# Patient Record
Sex: Male | Born: 1975 | Race: White | Hispanic: No | Marital: Single | Smoking: Never smoker
Health system: Southern US, Community
[De-identification: ages and names within clinical notes are randomized; demographics above are authoritative.]

## PROBLEM LIST (undated history)

## (undated) DIAGNOSIS — J189 Pneumonia, unspecified organism: Secondary | ICD-10-CM

## (undated) HISTORY — PX: COLONOSCOPY: SHX174

## (undated) HISTORY — PX: VASECTOMY: SHX75

---

## 2004-03-28 HISTORY — PX: WISDOM TOOTH EXTRACTION: SHX21

## 2014-04-18 ENCOUNTER — Ambulatory Visit: Payer: Self-pay | Admitting: Family

## 2014-04-22 ENCOUNTER — Ambulatory Visit (INDEPENDENT_AMBULATORY_CARE_PROVIDER_SITE_OTHER): Payer: Managed Care, Other (non HMO) | Admitting: Family

## 2014-04-22 ENCOUNTER — Other Ambulatory Visit (INDEPENDENT_AMBULATORY_CARE_PROVIDER_SITE_OTHER): Payer: Managed Care, Other (non HMO)

## 2014-04-22 ENCOUNTER — Encounter: Payer: Self-pay | Admitting: Family

## 2014-04-22 VITALS — BP 130/88 | HR 71 | Temp 98.2°F | Resp 18 | Ht 72.0 in | Wt 187.1 lb

## 2014-04-22 DIAGNOSIS — Z23 Encounter for immunization: Secondary | ICD-10-CM

## 2014-04-22 DIAGNOSIS — Z0000 Encounter for general adult medical examination without abnormal findings: Secondary | ICD-10-CM | POA: Insufficient documentation

## 2014-04-22 LAB — HEMOGLOBIN A1C: HEMOGLOBIN A1C: 5.1 % (ref 4.6–6.5)

## 2014-04-22 LAB — BASIC METABOLIC PANEL
BUN: 15 mg/dL (ref 6–23)
CO2: 26 meq/L (ref 19–32)
Calcium: 9.6 mg/dL (ref 8.4–10.5)
Chloride: 107 mEq/L (ref 96–112)
Creatinine, Ser: 0.95 mg/dL (ref 0.40–1.50)
GFR: 94.94 mL/min (ref >60.00)
Glucose, Bld: 95 mg/dL (ref 70–99)
Potassium: 4.7 mEq/L (ref 3.5–5.1)
SODIUM: 142 meq/L (ref 135–145)

## 2014-04-22 LAB — LIPID PANEL
Cholesterol: 163 mg/dL (ref 0–200)
HDL: 41.7 mg/dL (ref >39.00)
LDL Cholesterol: 93 mg/dL (ref 0–99)
NONHDL: 121.3
Total CHOL/HDL Ratio: 4
Triglycerides: 142 mg/dL (ref 0.0–149.0)
VLDL: 28.4 mg/dL (ref 0.0–40.0)

## 2014-04-22 LAB — CBC
HCT: 45.3 % (ref 39.0–52.0)
HEMOGLOBIN: 15.7 g/dL (ref 13.0–17.0)
MCHC: 34.6 g/dL (ref 30.0–36.0)
MCV: 88.5 fl (ref 78.0–100.0)
PLATELETS: 245 10*3/uL (ref 150.0–400.0)
RBC: 5.13 Mil/uL (ref 4.22–5.81)
RDW: 12.3 % (ref 11.5–15.5)
WBC: 5.3 10*3/uL (ref 4.0–10.5)

## 2014-04-22 LAB — TSH: TSH: 1.23 u[IU]/mL (ref 0.35–4.50)

## 2014-04-22 NOTE — Patient Instructions (Signed)
Thank you for choosing [facility].  Summary/Instructions:  Your prescription(s) have been submitted to your pharmacy or been printed and provided for you. Please take as directed and contact our office if you believe you are having problem(s) with the medication(s) or have any questions.  Please stop by the lab on the basement level of the building for your blood work. Your results will be released to MyChart (or called to you) after review, usually within 72 hours after test completion. If any changes need to be made, you will be notified at that same time.  Please stop by radiology on the basement level of the building for your x-rays. Your results will be released to MyChart (or called to you) after review, usually within 72 hours after test completion. If any treatments or changes are necessary, you will be notified at that same time.  Referrals have been made during this visit. You should expect to hear back from our schedulers in about 7-10 days in regards to establishing an appointment with the specialists we discussed.   Health Maintenance A healthy lifestyle and preventative care can promote health and wellness.  Maintain regular health, dental, and eye exams.  Eat a healthy diet. Foods like vegetables, fruits, whole grains, low-fat dairy products, and lean protein foods contain the nutrients you need and are low in calories. Decrease your intake of foods high in solid fats, added sugars, and salt. Get information about a proper diet from your health care provider, if necessary.  Regular physical exercise is one of the most important things you can do for your health. Most adults should get at least 150 minutes of moderate-intensity exercise (any activity that increases your heart rate and causes you to sweat) each week. In addition, most adults need muscle-strengthening exercises on 2 or more days a week.   Maintain a healthy weight. The body mass index (BMI) is a screening  tool to identify possible weight problems. It provides an estimate of body fat based on height and weight. Your health care provider can find your BMI and can help you achieve or maintain a healthy weight. For males 20 years and older:  A BMI below 18.5 is considered underweight.  A BMI of 18.5 to 24.9 is normal.  A BMI of 25 to 29.9 is considered overweight.  A BMI of 30 and above is considered obese.  Maintain normal blood lipids and cholesterol by exercising and minimizing your intake of saturated fat. Eat a balanced diet with plenty of fruits and vegetables. Blood tests for lipids and cholesterol should begin at age 47 and be repeated every 5 years. If your lipid or cholesterol levels are high, you are over age 75, or you are at high risk for heart disease, you may need your cholesterol levels checked more frequently.Ongoing high lipid and cholesterol levels should be treated with medicines if diet and exercise are not working.  If you smoke, find out from your health care provider how to quit. If you do not use tobacco, do not start.  Lung cancer screening is recommended for adults aged 55-80 years who are at high risk for developing lung cancer because of a history of smoking. A yearly low-dose CT scan of the lungs is recommended for people who have at least a 30-pack-year history of smoking and are current smokers or have quit within the past 15 years. A pack year of smoking is smoking an average of 1 pack of cigarettes a day for 1 year (for  example, a 30-pack-year history of smoking could mean smoking 1 pack a day for 30 years or 2 packs a day for 15 years). Yearly screening should continue until the smoker has stopped smoking for at least 15 years. Yearly screening should be stopped for people who develop a health problem that would prevent them from having lung cancer treatment.  If you choose to drink alcohol, do not have more than 2 drinks per day. One drink is considered to be 12 oz  (360 mL) of beer, 5 oz (150 mL) of wine, or 1.5 oz (45 mL) of liquor.  Avoid the use of street drugs. Do not share needles with anyone. Ask for help if you need support or instructions about stopping the use of drugs.  High blood pressure causes heart disease and increases the risk of stroke. Blood pressure should be checked at least every 1-2 years. Ongoing high blood pressure should be treated with medicines if weight loss and exercise are not effective.  If you are 2645-37 years old, ask your health care provider if you should take aspirin to prevent heart disease.  Diabetes screening involves taking a blood sample to check your fasting blood sugar level. This should be done once every 3 years after age 37 if you are at a normal weight and without risk factors for diabetes. Testing should be considered at a younger age or be carried out more frequently if you are overweight and have at least 1 risk factor for diabetes.  Colorectal cancer can be detected and often prevented. Most routine colorectal cancer screening begins at the age of 37 and continues through age 37. However, your health care provider may recommend screening at an earlier age if you have risk factors for colon cancer. On a yearly basis, your health care provider may provide home test kits to check for hidden blood in the stool. A small camera at the end of a tube may be used to directly examine the colon (sigmoidoscopy or colonoscopy) to detect the earliest forms of colorectal cancer. Talk to your health care provider about this at age 37 when routine screening begins. A direct exam of the colon should be repeated every 5-10 years through age 37, unless early forms of precancerous polyps or small growths are found.  People who are at an increased risk for hepatitis B should be screened for this virus. You are considered at high risk for hepatitis B if:  You were born in a country where hepatitis B occurs often. Talk with your health  care provider about which countries are considered high risk.  Your parents were born in a high-risk country and you have not received a shot to protect against hepatitis B (hepatitis B vaccine).  You have HIV or AIDS.  You use needles to inject street drugs.  You live with, or have sex with, someone who has hepatitis B.  You are a man who has sex with other men (MSM).  You get hemodialysis treatment.  You take certain medicines for conditions like cancer, organ transplantation, and autoimmune conditions.  Hepatitis C blood testing is recommended for all people born from [id number] through 1965 and any individual with known risk factors for hepatitis C.  Healthy men should no longer receive prostate-specific antigen (PSA) blood tests as part of routine cancer screening. Talk to your health care provider about prostate cancer screening.  Testicular cancer screening is not recommended for adolescents or adult males who have no symptoms. Screening includes self-exam, a  health care provider exam, and other screening tests. Consult with your health care provider about any symptoms you have or any concerns you have about testicular cancer.  Practice safe sex. Use condoms and avoid high-risk sexual practices to reduce the spread of sexually transmitted infections (STIs).  You should be screened for STIs, including gonorrhea and chlamydia if:  You are sexually active and are younger than 24 years.  You are older than 24 years, and your health care provider tells you that you are at risk for this type of infection.  Your sexual activity has changed since you were last screened, and you are at an increased risk for chlamydia or gonorrhea. Ask your health care provider if you are at risk.  If you are at risk of being infected with HIV, it is recommended that you take a prescription medicine daily to prevent HIV infection. This is called pre-exposure prophylaxis (PrEP). You are considered at risk  if:  You are a man who has sex with other men (MSM).  You are a heterosexual man who is sexually active with multiple partners.  You take drugs by injection.  You are sexually active with a partner who has HIV.  Talk with your health care provider about whether you are at high risk of being infected with HIV. If you choose to begin PrEP, you should first be tested for HIV. You should then be tested every 3 months for as long as you are taking PrEP.  Use sunscreen. Apply sunscreen liberally and repeatedly throughout the day. You should seek shade when your shadow is shorter than you. Protect yourself by wearing long sleeves, pants, a wide-brimmed hat, and sunglasses year round whenever you are outdoors.  Tell your health care provider of new moles or changes in moles, especially if there is a change in shape or color. Also, tell your health care provider if a mole is larger than the size of a pencil eraser.  A one-time screening for abdominal aortic aneurysm (AAA) and surgical repair of large AAAs by ultrasound is recommended for men aged 65-75 years who are current or former smokers.  Stay current with your vaccines (immunizations). Document Released: 09/10/2007 Document Revised: 03/19/2013 Document Reviewed: 08/09/2010 [facility] Patient Information 2015 [place], [place]. This information is not intended to replace advice given to you by your health care provider. Make sure you discuss any questions you have with your health care provider.

## 2014-04-22 NOTE — Progress Notes (Signed)
Subjective:    Patient ID: Greg Cooper, male    DOB: February 08, 1976, 39 y.o.   MRN: [id number]  Chief Complaint  Patient presents with  . Establish Care    Fasting would like a cpe    HPI:  Greg SkillBrian Heuberger is a 39 y.o. male who presents today for an annual wellness visit.   1) Health Maintenance - States his health is pretty good  Diet - eats 3 meals per day; vegetables, greens, protein; occasional fried and processed foods.   [facility] averaging 2-3 times per week.    2) Preventative Exams / Immunizations:  Dental -- Up to date  Vision -- Up to date    Health Maintenance  Topic Date Due  . TETANUS/TDAP  07/16/1996  . INFLUENZA VACCINE  10/26/2013  Tetanus shot;    There is no immunization history on file for this patient.  No Known Allergies   No current outpatient prescriptions on file prior to visit.   No current facility-administered medications on file prior to visit.    History reviewed. No pertinent past medical history.  History reviewed. No pertinent past surgical history.  Family History  Problem Relation Age of Onset  . Leukemia Father     History   Social History  . Marital Status: Married    Spouse Name: N/A    Number of Children: 2  . Years of Education: 18   Occupational History  . [employer]    Social History Main Topics  . Smoking status: Never Smoker   . Smokeless tobacco: Never Used  . Alcohol Use: 2.4 oz/week    0 Not specified, 4 Cans of beer per week  . Drug Use: No  . Sexual Activity: Not on file   Other Topics Concern  . Not on file   Social History Narrative   [place] Currently resides in a house with his wife and 2 children. 2 dogs. Fun: Softball, gym   Denies religious beliefs that would effect health care.     Review of Systems  Constitutional: Denies fever, chills, fatigue, or significant weight gain/loss. HENT: Head: Denies  headache or neck pain Ears: Denies changes in hearing, ringing in ears, earache, drainage Nose: Denies discharge, stuffiness, itching, nosebleed, sinus pain Throat: Denies sore throat, hoarseness, dry mouth, sores, thrush Eyes: Denies loss/changes in vision, pain, redness, blurry/double vision, flashing lights Cardiovascular: Denies chest pain/discomfort, tightness, palpitations, shortness of breath with activity, difficulty lying down, swelling, sudden awakening with shortness of breath Respiratory: Denies shortness of breath, cough, sputum production, wheezing Gastrointestinal: Denies dysphasia, heartburn, change in appetite, nausea, change in bowel habits, rectal bleeding, constipation, diarrhea, yellow skin or eyes Genitourinary: Denies frequency, urgency, burning/pain, blood in urine, incontinence, change in urinary strength. Musculoskeletal: Denies muscle/joint pain, stiffness, back pain, redness or swelling of joints, trauma Skin: Denies rashes, lumps, itching, dryness, color changes, or hair/nail changes Neurological: Denies dizziness, fainting, seizures, weakness, numbness, tingling, tremor Psychiatric - Denies nervousness, stress, depression or memory loss Endocrine: Denies heat or cold intolerance, sweating, frequent urination, excessive thirst, changes in appetite Hematologic: Denies ease of bruising or bleeding     Objective:    BP 130/88 mmHg  Pulse 71  Temp(Src) 98.2 F (36.8 C) (Oral)  Resp 18  Ht 6' (1.829 m)  Wt 187 lb 1.9 oz (84.877 kg)  BMI 25.37 kg/m2  SpO2 97% Nursing note and vital signs reviewed.  Physical Exam  Constitutional: He is oriented to person, place, and time. He appears well-developed and well-nourished.  HENT:  Head: Normocephalic.  Right Ear: Hearing, tympanic membrane, external ear and ear canal normal.  Left Ear: Hearing, tympanic membrane, external ear and ear canal normal.  Nose: Nose normal.  Mouth/Throat: Uvula is midline, oropharynx is  clear and moist and mucous membranes are normal.  Eyes: Conjunctivae and EOM are normal. Pupils are equal, round, and reactive to light.  Neck: Neck supple. No JVD present. No tracheal deviation present. No thyromegaly present.  Cardiovascular: Normal rate, regular rhythm, normal heart sounds and intact distal pulses.   Pulmonary/Chest: Effort normal and breath sounds normal.  Abdominal: Soft. Bowel sounds are normal. He exhibits no distension and no mass. There is no tenderness. There is no rebound and no guarding.  Musculoskeletal: Normal range of motion. He exhibits no edema or tenderness.  Lymphadenopathy:    He has no cervical adenopathy.  Neurological: He is alert and oriented to person, place, and time. He has normal reflexes. No cranial nerve deficit. He exhibits normal muscle tone. Coordination normal.  Skin: Skin is warm and dry.  Psychiatric: He has a normal mood and affect. His behavior is normal. Judgment and thought content normal.       Assessment & Plan:

## 2014-04-22 NOTE — Assessment & Plan Note (Signed)
1) Anticipatory Guidance: Discussed importance of wearing a seatbelt while driving and not texting while driving; changing batteries in smoke detector at least once annually; wearing suntan lotion when outside; eating a balanced and moderate diet; getting physical activity at least 30 minutes per day.  2) Immunizations / Screenings / Labs:  Tetanus shot given in office today. All other immunizations are up to date per recommendations. All screenings are up to date per recommendations. Obtain CBC, BMET, Lipid profile and TSH.    Overall well exam. Minimal risk factors noted at this time. Continue current healthy lifestyle choices. Follow up prevention exam in 1 year.

## 2014-04-22 NOTE — Addendum Note (Signed)
Addended by: Mercer PodWRENN, Itsel Opfer E on: 04/22/2014 09:07 AM   Modules accepted: Orders

## 2014-04-22 NOTE — Progress Notes (Signed)
Pre visit review using our clinic review tool, if applicable. No additional management support is needed unless otherwise documented below in the visit note. 

## 2014-04-23 ENCOUNTER — Telehealth: Payer: Self-pay | Admitting: Family

## 2014-04-23 NOTE — Telephone Encounter (Signed)
Please inform the patient that his lab work is all within the normal expected ranges. Therefore follow up or 1 year or sooner if needed.

## 2014-04-23 NOTE — Telephone Encounter (Signed)
Called pt. Left message for him to call back.

## 2014-04-24 NOTE — Telephone Encounter (Signed)
Pt aware of results 

## 2015-01-14 ENCOUNTER — Ambulatory Visit (INDEPENDENT_AMBULATORY_CARE_PROVIDER_SITE_OTHER): Payer: Managed Care, Other (non HMO) | Admitting: Family

## 2015-01-14 ENCOUNTER — Encounter: Payer: Self-pay | Admitting: Family

## 2015-01-14 VITALS — BP 110/70 | HR 72 | Temp 97.9°F | Resp 18 | Ht 72.0 in | Wt 192.0 lb

## 2015-01-14 DIAGNOSIS — S39012A Strain of muscle, fascia and tendon of lower back, initial encounter: Secondary | ICD-10-CM | POA: Diagnosis not present

## 2015-01-14 NOTE — Assessment & Plan Note (Signed)
Symptoms and exam consistent with low back strain. Treat conservatively with heat, stretching and home exercise therapy, and over-the-counter anti-inflammatories as needed for discomfort. Follow up if symptoms worsen or fail to improve with conservative therapy.

## 2015-01-14 NOTE — Progress Notes (Signed)
   Subjective:    Patient ID: Greg Cooper, male    DOB: October 06, 1975, 39 y.o.   MRN: [id number]  Chief Complaint  Patient presents with  . Back Pain    Woke up a week or so ago with a pain in the lower part of his back, feels it most when sitting down for a long period and then standing up    HPI:  Greg SkillBrian Reveles is a 39 y.o. male who  has no past medical history on file. and presents today for an an acute office visit.   This is a new problem. Associated symptom of pain located in his lower back has been going on for about a week and described as dull and persistent and a severity of about 3/10. Denies radiculopathy or saddle anesthesia. Modifying factors include a heating pad and some OTC antiinflammatories which did not help very much.  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 Denies trauma or any sounds/sesations heard or felt. Did some resistance training a few days prior to onset.   No Known Allergies   No current outpatient prescriptions on file prior to visit.   No current facility-administered medications on file prior to visit.    Review of Systems  Constitutional: Negative for fever and chills.  Genitourinary: Negative for flank pain.  Musculoskeletal: Positive for back pain.  Neurological: Negative for weakness and numbness.      Objective:    BP 110/70 mmHg  Pulse 72  Temp(Src) 97.9 F (36.6 C) (Oral)  Resp 18  Ht 6' (1.829 m)  Wt 192 lb (87.091 kg)  BMI 26.03 kg/m2  SpO2 97% Nursing note and vital signs reviewed.  Physical Exam  Constitutional: He is oriented to person, place, and time. He appears well-developed and well-nourished. No distress.  Cardiovascular: Normal rate, regular rhythm, normal heart sounds and intact distal pulses.   Pulmonary/Chest: Effort normal and breath sounds normal.  Musculoskeletal:  Low back - no obvious deformity, discoloration, or edema noted. Tenderness elicited over lumbar paraspinal  musculature on the right side. Range of motion is limited secondary to muscular tightness. There is tightness noted in his hamstrings and hip flexors with a positive Thomas test. Straight leg raises positive. Distal pulses, sensation, and reflexes are intact and appropriate.  Neurological: He is alert and oriented to person, place, and time.  Skin: Skin is warm and dry.  Psychiatric: He has a normal mood and affect. His behavior is normal. Judgment and thought content normal.       Assessment & Plan:   Problem List Items Addressed This Visit      Musculoskeletal and Integument   Low back strain - Primary    Symptoms and exam consistent with low back strain. Treat conservatively with heat, stretching and home exercise therapy, and over-the-counter anti-inflammatories as needed for discomfort. Follow up if symptoms worsen or fail to improve with conservative therapy.

## 2015-01-14 NOTE — Patient Instructions (Signed)
Thank you for choosing [facility].  Summary/Instructions:  Ibuprofen or Aleve as needed.  Heat 2-3x per day - moist heat preferable or Thermacare.  If your symptoms worsen or fail to improve, please contact our office for further instruction, or in case of emergency go directly to the emergency room at the closest medical facility.   Low Back Strain With Rehab A strain is an injury in which a tendon or muscle is torn. The muscles and tendons of the lower back are vulnerable to strains. However, these muscles and tendons are very strong and require a great force to be injured. Strains are classified into three categories. Grade 1 strains cause pain, but the tendon is not lengthened. Grade 2 strains include a lengthened ligament, due to the ligament being stretched or partially ruptured. With grade 2 strains there is still function, although the function may be decreased. Grade 3 strains involve a complete tear of the tendon or muscle, and function is usually impaired. SYMPTOMS   Pain in the lower back.  Pain that affects one side more than the other.  Pain that gets worse with movement and may be felt in the hip, buttocks, or back of the thigh.  Muscle spasms of the muscles in the back.  Swelling along the muscles of the back.  Loss of strength of the back muscles.  Crackling sound (crepitation) when the muscles are touched. CAUSES  Lower back strains occur when a force is placed on the muscles or tendons that is greater than they can handle. Common causes of injury include:  Prolonged overuse of the muscle-tendon units in the lower back, usually from incorrect posture.  A single violent injury or force applied to the back. RISK INCREASES WITH:  Sports that involve twisting forces on the spine or a lot of bending at the waist (football, rugby, weightlifting, bowling, golf, tennis, speed skating, racquetball, swimming, running, gymnastics, diving).  Poor strength and  flexibility.  Failure to warm up properly before activity.  Family history of lower back pain or disk disorders.  Previous back injury or surgery (especially fusion).  Poor posture with lifting, especially heavy objects.  Prolonged sitting, especially with poor posture. PREVENTION   Learn and use proper posture when sitting or lifting (maintain proper posture when sitting, lift using the knees and legs, not at the waist).  Warm up and stretch properly before activity.  Allow for adequate recovery between workouts.  Maintain physical fitness:  Strength, flexibility, and endurance.  Cardiovascular fitness. PROGNOSIS  If treated properly, lower back strains usually heal within 6 weeks. RELATED COMPLICATIONS   Recurring symptoms, resulting in a chronic problem.  Chronic inflammation, scarring, and partial muscle-tendon tear.  Delayed healing or resolution of symptoms.  Prolonged disability. TREATMENT  Treatment first involves the use of ice and medicine, to reduce pain and inflammation. The use of strengthening and stretching exercises may help reduce pain with activity. These exercises may be performed at home or with a therapist. Severe injuries may require referral to a therapist for further evaluation and treatment, such as ultrasound. Your caregiver may advise that you wear a back brace or corset, to help reduce pain and discomfort. Often, prolonged bed rest results in greater harm then benefit. Corticosteroid injections may be recommended. However, these should be reserved for the most serious cases. It is important to avoid using your back when lifting objects. At night, sleep on your back on a firm mattress with a pillow placed under your knees. If non-surgical  treatment is unsuccessful, surgery may be needed.  MEDICATION   If pain medicine is needed, nonsteroidal anti-inflammatory medicines (aspirin and ibuprofen), or other minor pain relievers (acetaminophen), are often  advised.  Do not take pain medicine for 7 days before surgery.  Prescription pain relievers may be given, if your caregiver thinks they are needed. Use only as directed and only as much as you need.  Ointments applied to the skin may be helpful.  Corticosteroid injections may be given by your caregiver. These injections should be reserved for the most serious cases, because they may only be given a certain number of times. HEAT AND COLD  Cold treatment (icing) should be applied for 10 to 15 minutes every 2 to 3 hours for inflammation and pain, and immediately after activity that aggravates your symptoms. Use ice packs or an ice massage.  Heat treatment may be used before performing stretching and strengthening activities prescribed by your caregiver, physical therapist, or athletic trainer. Use a heat pack or a warm water soak. SEEK MEDICAL CARE IF:   Symptoms get worse or do not improve in 2 to 4 weeks, despite treatment.  You develop numbness, weakness, or loss of bowel or bladder function.  New, unexplained symptoms develop. (Drugs used in treatment may produce side effects.) EXERCISES  RANGE OF MOTION (ROM) AND STRETCHING EXERCISES - Low Back Strain Most people with lower back pain will find that their symptoms get worse with excessive bending forward (flexion) or arching at the lower back (extension). The exercises which will help resolve your symptoms will focus on the opposite motion.  Your physician, physical therapist or athletic trainer will help you determine which exercises will be most helpful to resolve your lower back pain. Do not complete any exercises without first consulting with your caregiver. Discontinue any exercises which make your symptoms worse until you speak to your caregiver.  If you have pain, numbness or tingling which travels down into your buttocks, leg or foot, the goal of the therapy is for these symptoms to move closer to your back and eventually resolve.  Sometimes, these leg symptoms will get better, but your lower back pain may worsen. This is typically an indication of progress in your rehabilitation. Be very alert to any changes in your symptoms and the activities in which you participated in the 24 hours prior to the change. Sharing this information with your caregiver will allow him/her to most efficiently treat your condition.  These exercises may help you when beginning to rehabilitate your injury. Your symptoms may resolve with or without further involvement from your physician, physical therapist or athletic trainer. While completing these exercises, remember:  Restoring tissue flexibility helps normal motion to return to the joints. This allows healthier, less painful movement and activity.  An effective stretch should be held for at least 30 seconds.  A stretch should never be painful. You should only feel a gentle lengthening or release in the stretched tissue. FLEXION RANGE OF MOTION AND STRETCHING EXERCISES: STRETCH - Flexion, Single Knee to Chest   Lie on a firm bed or floor with both legs extended in front of you.  Keeping one leg in contact with the floor, bring your opposite knee to your chest. Hold your leg in place by either grabbing behind your thigh or at your knee.  Pull until you feel a gentle stretch in your lower back. Hold __________ seconds.  Slowly release your grasp and repeat the exercise with the opposite side. Repeat __________ times.  Complete this exercise __________ times per day.  STRETCH - Flexion, Double Knee to Chest   Lie on a firm bed or floor with both legs extended in front of you.  Keeping one leg in contact with the floor, bring your opposite knee to your chest.  Tense your stomach muscles to support your back and then lift your other knee to your chest. Hold your legs in place by either grabbing behind your thighs or at your knees.  Pull both knees toward your chest until you feel a gentle  stretch in your lower back. Hold __________ seconds.  Tense your stomach muscles and slowly return one leg at a time to the floor. Repeat __________ times. Complete this exercise __________ times per day.  STRETCH - Low Trunk Rotation  Lie on a firm bed or floor. Keeping your legs in front of you, bend your knees so they are both pointed toward the ceiling and your feet are flat on the floor.  Extend your arms out to the side. This will stabilize your upper body by keeping your shoulders in contact with the floor.  Gently and slowly drop both knees together to one side until you feel a gentle stretch in your lower back. Hold for __________ seconds.  Tense your stomach muscles to support your lower back as you bring your knees back to the starting position. Repeat the exercise to the other side. Repeat __________ times. Complete this exercise __________ times per day  EXTENSION RANGE OF MOTION AND FLEXIBILITY EXERCISES: STRETCH - Extension, Prone on Elbows   Lie on your stomach on the floor, a bed will be too soft. Place your palms about shoulder width apart and at the height of your head.  Place your elbows under your shoulders. If this is too painful, stack pillows under your chest.  Allow your body to relax so that your hips drop lower and make contact more completely with the floor.  Hold this position for __________ seconds.  Slowly return to lying flat on the floor. Repeat __________ times. Complete this exercise __________ times per day.  RANGE OF MOTION - Extension, Prone Press Ups  Lie on your stomach on the floor, a bed will be too soft. Place your palms about shoulder width apart and at the height of your head.  Keeping your back as relaxed as possible, slowly straighten your elbows while keeping your hips on the floor. You may adjust the placement of your hands to maximize your comfort. As you gain motion, your hands will come more underneath your shoulders.  Hold this  position __________ seconds.  Slowly return to lying flat on the floor. Repeat __________ times. Complete this exercise __________ times per day.  RANGE OF MOTION- Quadruped, Neutral Spine   Assume a hands and knees position on a firm surface. Keep your hands under your shoulders and your knees under your hips. You may place padding under your knees for comfort.  Drop your head and point your tail bone toward the ground below you. This will round out your lower back like an angry cat. Hold this position for __________ seconds.  Slowly lift your head and release your tail bone so that your back sags into a large arch, like an old horse.  Hold this position for __________ seconds.  Repeat this until you feel limber in your lower back.  Now, find your "sweet spot." This will be the most comfortable position somewhere between the two previous positions. This is your neutral  spine. Once you have found this position, tense your stomach muscles to support your lower back.  Hold this position for __________ seconds. Repeat __________ times. Complete this exercise __________ times per day.  STRENGTHENING EXERCISES - Low Back Strain These exercises may help you when beginning to rehabilitate your injury. These exercises should be done near your "sweet spot." This is the neutral, low-back arch, somewhere between fully rounded and fully arched, that is your least painful position. When performed in this safe range of motion, these exercises can be used for people who have either a flexion or extension based injury. These exercises may resolve your symptoms with or without further involvement from your physician, physical therapist or athletic trainer. While completing these exercises, remember:   Muscles can gain both the endurance and the strength needed for everyday activities through controlled exercises.  Complete these exercises as instructed by your physician, physical therapist or athletic  trainer. Increase the resistance and repetitions only as guided.  You may experience muscle soreness or fatigue, but the pain or discomfort you are trying to eliminate should never worsen during these exercises. If this pain does worsen, stop and make certain you are following the directions exactly. If the pain is still present after adjustments, discontinue the exercise until you can discuss the trouble with your caregiver. STRENGTHENING - Deep Abdominals, Pelvic Tilt  Lie on a firm bed or floor. Keeping your legs in front of you, bend your knees so they are both pointed toward the ceiling and your feet are flat on the floor.  Tense your lower abdominal muscles to press your lower back into the floor. This motion will rotate your pelvis so that your tail bone is scooping upwards rather than pointing at your feet or into the floor.  With a gentle tension and even breathing, hold this position for __________ seconds. Repeat __________ times. Complete this exercise __________ times per day.  STRENGTHENING - Abdominals, Crunches   Lie on a firm bed or floor. Keeping your legs in front of you, bend your knees so they are both pointed toward the ceiling and your feet are flat on the floor. Cross your arms over your chest.  Slightly tip your chin down without bending your neck.  Tense your abdominals and slowly lift your trunk high enough to just clear your shoulder blades. Lifting higher can put excessive stress on the lower back and does not further strengthen your abdominal muscles.  Control your return to the starting position. Repeat __________ times. Complete this exercise __________ times per day.  STRENGTHENING - Quadruped, Opposite UE/LE Lift   Assume a hands and knees position on a firm surface. Keep your hands under your shoulders and your knees under your hips. You may place padding under your knees for comfort.  Find your neutral spine and gently tense your abdominal muscles so that  you can maintain this position. Your shoulders and hips should form a rectangle that is parallel with the floor and is not twisted.  Keeping your trunk steady, lift your right hand no higher than your shoulder and then your left leg no higher than your hip. Make sure you are not holding your breath. Hold this position __________ seconds.  Continuing to keep your abdominal muscles tense and your back steady, slowly return to your starting position. Repeat with the opposite arm and leg. Repeat __________ times. Complete this exercise __________ times per day.  STRENGTHENING - Lower Abdominals, Double Knee Lift  Lie on a firm  bed or floor. Keeping your legs in front of you, bend your knees so they are both pointed toward the ceiling and your feet are flat on the floor.  Tense your abdominal muscles to brace your lower back and slowly lift both of your knees until they come over your hips. Be certain not to hold your breath.  Hold __________ seconds. Using your abdominal muscles, return to the starting position in a slow and controlled manner. Repeat __________ times. Complete this exercise __________ times per day.  POSTURE AND BODY MECHANICS CONSIDERATIONS - Low Back Strain Keeping correct posture when sitting, standing or completing your activities will reduce the stress put on different body tissues, allowing injured tissues a chance to heal and limiting painful experiences. The following are general guidelines for improved posture. Your physician or physical therapist will provide you with any instructions specific to your needs. While reading these guidelines, remember:  The exercises prescribed by your provider will help you have the flexibility and strength to maintain correct postures.  The correct posture provides the best environment for your joints to work. All of your joints have less wear and tear when properly supported by a spine with good posture. This means you will experience a  healthier, less painful body.  Correct posture must be practiced with all of your activities, especially prolonged sitting and standing. Correct posture is as important when doing repetitive low-stress activities (typing) as it is when doing a single heavy-load activity (lifting). RESTING POSITIONS Consider which positions are most painful for you when choosing a resting position. If you have pain with flexion-based activities (sitting, bending, stooping, squatting), choose a position that allows you to rest in a less flexed posture. You would want to avoid curling into a fetal position on your side. If your pain worsens with extension-based activities (prolonged standing, working overhead), avoid resting in an extended position such as sleeping on your stomach. Most people will find more comfort when they rest with their spine in a more neutral position, neither too rounded nor too arched. Lying on a non-sagging bed on your side with a pillow between your knees, or on your back with a pillow under your knees will often provide some relief. Keep in mind, being in any one position for a prolonged period of time, no matter how correct your posture, can still lead to stiffness. PROPER SITTING POSTURE In order to minimize stress and discomfort on your spine, you must sit with correct posture. Sitting with good posture should be effortless for a healthy body. Returning to good posture is a gradual process. Many people can work toward this most comfortably by using various supports until they have the flexibility and strength to maintain this posture on their own. When sitting with proper posture, your ears will fall over your shoulders and your shoulders will fall over your hips. You should use the back of the chair to support your upper back. Your lower back will be in a neutral position, just slightly arched. You may place a small pillow or folded towel at the base of your lower back for support.  When working  at a desk, create an environment that supports good, upright posture. Without extra support, muscles tire, which leads to excessive strain on joints and other tissues. Keep these recommendations in mind: CHAIR:  A chair should be able to slide under your desk when your back makes contact with the back of the chair. This allows you to work closely.  The chair's  height should allow your eyes to be level with the upper part of your monitor and your hands to be slightly lower than your elbows. BODY POSITION  Your feet should make contact with the floor. If this is not possible, use a foot rest.  Keep your ears over your shoulders. This will reduce stress on your neck and lower back. INCORRECT SITTING POSTURES  If you are feeling tired and unable to assume a healthy sitting posture, do not slouch or slump. This puts excessive strain on your back tissues, causing more damage and pain. Healthier options include:  Using more support, like a lumbar pillow.  Switching tasks to something that requires you to be upright or walking.  Talking a brief walk.  Lying down to rest in a neutral-spine position. PROLONGED STANDING WHILE SLIGHTLY LEANING FORWARD  When completing a task that requires you to lean forward while standing in one place for a long time, place either foot up on a stationary 2-4 inch high object to help maintain the best posture. When both feet are on the ground, the lower back tends to lose its slight inward curve. If this curve flattens (or becomes too large), then the back and your other joints will experience too much stress, tire more quickly, and can cause pain. CORRECT STANDING POSTURES Proper standing posture should be assumed with all daily activities, even if they only take a few moments, like when brushing your teeth. As in sitting, your ears should fall over your shoulders and your shoulders should fall over your hips. You should keep a slight tension in your abdominal muscles  to brace your spine. Your tailbone should point down to the ground, not behind your body, resulting in an over-extended swayback posture.  INCORRECT STANDING POSTURES  Common incorrect standing postures include a forward head, locked knees and/or an excessive swayback. WALKING Walk with an upright posture. Your ears, shoulders and hips should all line-up. PROLONGED ACTIVITY IN A FLEXED POSITION When completing a task that requires you to bend forward at your waist or lean over a low surface, try to find a way to stabilize 3 out of 4 of your limbs. You can place a hand or elbow on your thigh or rest a knee on the surface you are reaching across. This will provide you more stability so that your muscles do not fatigue as quickly. By keeping your knees relaxed, or slightly bent, you will also reduce stress across your lower back. CORRECT LIFTING TECHNIQUES DO :   Assume a wide stance. This will provide you more stability and the opportunity to get as close as possible to the object which you are lifting.  Tense your abdominals to brace your spine. Bend at the knees and hips. Keeping your back locked in a neutral-spine position, lift using your leg muscles. Lift with your legs, keeping your back straight.  Test the weight of unknown objects before attempting to lift them.  Try to keep your elbows locked down at your sides in order get the best strength from your shoulders when carrying an object.  Always ask for help when lifting heavy or awkward objects. INCORRECT LIFTING TECHNIQUES DO NOT:   Lock your knees when lifting, even if it is a small object.  Bend and twist. Pivot at your feet or move your feet when needing to change directions.  Assume that you can safely pick up even a paper clip without proper posture.   This information is not intended to replace advice  given to you by your health care provider. Make sure you discuss any questions you have with your health care provider.     Document Released: 03/14/2005 Document Revised: 04/04/2014 Document Reviewed: 06/26/2008 [employer] Interactive Patient Education [employer]

## 2015-01-14 NOTE — Progress Notes (Signed)
Pre visit review using our clinic review tool, if applicable. No additional management support is needed unless otherwise documented below in the visit note.   Getting flu shot at work 

## 2015-03-11 ENCOUNTER — Telehealth: Payer: Self-pay

## 2015-03-11 NOTE — Telephone Encounter (Signed)
Left Voice Mail for pt to call back.   RE: Flu Vaccine for 2016  

## 2015-10-08 ENCOUNTER — Encounter: Payer: Self-pay | Admitting: Family

## 2015-10-08 ENCOUNTER — Ambulatory Visit (INDEPENDENT_AMBULATORY_CARE_PROVIDER_SITE_OTHER): Payer: Managed Care, Other (non HMO) | Admitting: Family

## 2015-10-08 VITALS — BP 120/78 | HR 77 | Temp 98.3°F | Resp 16 | Ht 72.0 in | Wt 194.0 lb

## 2015-10-08 DIAGNOSIS — R21 Rash and other nonspecific skin eruption: Secondary | ICD-10-CM | POA: Diagnosis not present

## 2015-10-08 DIAGNOSIS — Z0001 Encounter for general adult medical examination with abnormal findings: Secondary | ICD-10-CM | POA: Diagnosis not present

## 2015-10-08 DIAGNOSIS — R6889 Other general symptoms and signs: Secondary | ICD-10-CM | POA: Diagnosis not present

## 2015-10-08 MED ORDER — KETOCONAZOLE 2 % EX CREA: 1.0000 "application " | TOPICAL_CREAM | Freq: Every day | CUTANEOUS | Status: DC

## 2015-10-08 NOTE — Assessment & Plan Note (Signed)
Rash consistent with tinea corpus. Start ketoconazole. Follow up if symptoms worsen or do not improve.

## 2015-10-08 NOTE — Patient Instructions (Signed)
Thank you for choosing [facility].  Summary/Instructions:  Your prescription(s) have been submitted to your pharmacy or been printed and provided for you. Please take as directed and contact our office if you believe you are having problem(s) with the medication(s) or have any questions.  Please stop by the lab on the lower level of the building for your blood work. Your results will be released to [facility] (or called to you) after review, usually within 72 hours after test completion. If any changes need to be made, you will be notified at that same time.  1. The lab is open from 7:30am to 5:30 pm Monday-Friday  2. No appointment is necessary  3. Fasting (if needed) is 6-8 hours after food and drink; black coffee  and water are okay   If your symptoms worsen or fail to improve, please contact our office for further instruction, or in case of emergency go directly to the emergency room at the closest medical facility.   Health Maintenance, Male A healthy lifestyle and preventative care can promote health and wellness.  Maintain regular health, dental, and eye exams.  Eat a healthy diet. Foods like vegetables, fruits, whole grains, low-fat dairy products, and lean protein foods contain the nutrients you need and are low in calories. Decrease your intake of foods high in solid fats, added sugars, and salt. Get information about a proper diet from your health care provider, if necessary.  Regular physical exercise is one of the most important things you can do for your health. Most adults should get at least 150 minutes of moderate-intensity exercise (any activity that increases your heart rate and causes you to sweat) each week. In addition, most adults need muscle-strengthening exercises on 2 or more days a week.   Maintain a healthy weight. The body mass index (BMI) is a screening tool to identify possible weight problems. It provides an estimate of body fat based on height and weight.  Your health care provider can find your BMI and can help you achieve or maintain a healthy weight. For males 20 years and older:  A BMI below 18.5 is considered underweight.  A BMI of 18.5 to 24.9 is normal.  A BMI of 25 to 29.9 is considered overweight.  A BMI of 30 and above is considered obese.  Maintain normal blood lipids and cholesterol by exercising and minimizing your intake of saturated fat. Eat a balanced diet with plenty of fruits and vegetables. Blood tests for lipids and cholesterol should begin at age 51 and be repeated every 5 years. If your lipid or cholesterol levels are high, you are over age 99, or you are at high risk for heart disease, you may need your cholesterol levels checked more frequently.Ongoing high lipid and cholesterol levels should be treated with medicines if diet and exercise are not working.  If you smoke, find out from your health care provider how to quit. If you do not use tobacco, do not start.  Lung cancer screening is recommended for adults aged 42-80 years who are at high risk for developing lung cancer because of a history of smoking. A yearly low-dose CT scan of the lungs is recommended for people who have at least a 30-pack-year history of smoking and are current smokers or have quit within the past 15 years. A pack year of smoking is smoking an average of 1 pack of cigarettes a day for 1 year (for example, a 30-pack-year history of smoking could mean smoking 1 pack  a day for 30 years or 2 packs a day for 15 years). Yearly screening should continue until the smoker has stopped smoking for at least 15 years. Yearly screening should be stopped for people who develop a health problem that would prevent them from having lung cancer treatment.  If you choose to drink alcohol, do not have more than 2 drinks per day. One drink is considered to be 12 oz (360 mL) of beer, 5 oz (150 mL) of wine, or 1.5 oz (45 mL) of liquor.  Avoid the use of street drugs. Do not  share needles with anyone. Ask for help if you need support or instructions about stopping the use of drugs.  High blood pressure causes heart disease and increases the risk of stroke. High blood pressure is more likely to develop in:  People who have blood pressure in the end of the normal range (100-139/85-89 mm Hg).  People who are overweight or obese.  People who are African American.  If you are 38-46 years of age, have your blood pressure checked every 3-5 years. If you are 78 years of age or older, have your blood pressure checked every year. You should have your blood pressure measured twice--once when you are at a hospital or clinic, and once when you are not at a hospital or clinic. Record the average of the two measurements. To check your blood pressure when you are not at a hospital or clinic, you can use:  An automated blood pressure machine at a pharmacy.  A home blood pressure monitor.  If you are 19-61 years old, ask your health care provider if you should take aspirin to prevent heart disease.  Diabetes screening involves taking a blood sample to check your fasting blood sugar level. This should be done once every 3 years after age 39 if you are at a normal weight and without risk factors for diabetes. Testing should be considered at a younger age or be carried out more frequently if you are overweight and have at least 1 risk factor for diabetes.  Colorectal cancer can be detected and often prevented. Most routine colorectal cancer screening begins at the age of 53 and continues through age 54. However, your health care provider may recommend screening at an earlier age if you have risk factors for colon cancer. On a yearly basis, your health care provider may provide home test kits to check for hidden blood in the stool. A small camera at the end of a tube may be used to directly examine the colon (sigmoidoscopy or colonoscopy) to detect the earliest forms of colorectal cancer.  Talk to your health care provider about this at age 47 when routine screening begins. A direct exam of the colon should be repeated every 5-10 years through age 61, unless early forms of precancerous polyps or small growths are found.  People who are at an increased risk for hepatitis B should be screened for this virus. You are considered at high risk for hepatitis B if:  You were born in a country where hepatitis B occurs often. Talk with your health care provider about which countries are considered high risk.  Your parents were born in a high-risk country and you have not received a shot to protect against hepatitis B (hepatitis B vaccine).  You have HIV or AIDS.  You use needles to inject street drugs.  You live with, or have sex with, someone who has hepatitis B.  You are a man who  has sex with other men (MSM).  You get hemodialysis treatment.  You take certain medicines for conditions like cancer, organ transplantation, and autoimmune conditions.  Hepatitis C blood testing is recommended for all people born from [birth year] through 1963 and any individual with known risk factors for hepatitis C.  Healthy men should no longer receive prostate-specific antigen (PSA) blood tests as part of routine cancer screening. Talk to your health care provider about prostate cancer screening.  Testicular cancer screening is not recommended for adolescents or adult males who have no symptoms. Screening includes self-exam, a health care provider exam, and other screening tests. Consult with your health care provider about any symptoms you have or any concerns you have about testicular cancer.  Practice safe sex. Use condoms and avoid high-risk sexual practices to reduce the spread of sexually transmitted infections (STIs).  You should be screened for STIs, including gonorrhea and chlamydia if:  You are sexually active and are younger than 24 years.  You are older than 24 years, and your health care  provider tells you that you are at risk for this type of infection.  Your sexual activity has changed since you were last screened, and you are at an increased risk for chlamydia or gonorrhea. Ask your health care provider if you are at risk.  If you are at risk of being infected with HIV, it is recommended that you take a prescription medicine daily to prevent HIV infection. This is called pre-exposure prophylaxis (PrEP). You are considered at risk if:  You are a man who has sex with other men (MSM).  You are a heterosexual man who is sexually active with multiple partners.  You take drugs by injection.  You are sexually active with a partner who has HIV.  Talk with your health care provider about whether you are at high risk of being infected with HIV. If you choose to begin PrEP, you should first be tested for HIV. You should then be tested every 3 months for as long as you are taking PrEP.  Use sunscreen. Apply sunscreen liberally and repeatedly throughout the day. You should seek shade when your shadow is shorter than you. Protect yourself by wearing long sleeves, pants, a wide-brimmed hat, and sunglasses year round whenever you are outdoors.  Tell your health care provider of new moles or changes in moles, especially if there is a change in shape or color. Also, tell your health care provider if a mole is larger than the size of a pencil eraser.  A one-time screening for abdominal aortic aneurysm (AAA) and surgical repair of large AAAs by ultrasound is recommended for men aged 65-75 years who are current or former smokers.  Stay current with your vaccines (immunizations).   This information is not intended to replace advice given to you by your health care provider. Make sure you discuss any questions you have with your health care provider.   Document Released: 09/10/2007 Document Revised: 04/04/2014 Document Reviewed: 08/09/2010 Elsevier Interactive Patient Education 2016 Elsevier  Inc.  Body Ringworm Ringworm (tinea corporis) is a fungal infection of the skin on the body. This infection is not caused by worms, but is actually caused by a fungus. Fungus normally lives on the top of your skin and can be useful. However, in the case of ringworms, the fungus grows out of control and causes a skin infection. It can involve any area of skin on the body and can spread easily from one person to another (contagious).  Ringworm is a common problem for children, but it can affect adults as well. Ringworm is also often found in athletes, especially wrestlers who share equipment and mats.  CAUSES  Ringworm of the body is caused by a fungus called dermatophyte. It can spread by:  Touchingother people who are infected.  Touchinginfected pets.  Touching or sharingobjects that have been in contact with the infected person or pet (hats, combs, towels, clothing, sports equipment). SYMPTOMS   Itchy, raised red spots and bumps on the skin.  Ring-shaped rash.  Redness near the border of the rash with a clear center.  Dry and scaly skin on or around the rash. Not every person develops a ring-shaped rash. Some develop only the red, scaly patches. DIAGNOSIS  Most often, ringworm can be diagnosed by performing a skin exam. Your caregiver may choose to take a skin scraping from the affected area. The sample will be examined under the microscope to see if the fungus is present.  TREATMENT  Body ringworm may be treated with a topical antifungal cream or ointment. Sometimes, an antifungal shampoo that can be used on your body is prescribed. You may be prescribed antifungal medicines to take by mouth if your ringworm is severe, keeps coming back, or lasts a long time.  HOME CARE INSTRUCTIONS   Only take over-the-counter or prescription medicines as directed by your caregiver.  Wash the infected area and dry it completely before applying yourcream or ointment.  When using antifungal  shampoo to treat the ringworm, leave the shampoo on the body for 3-5 minutes before rinsing.   Wear loose clothing to stop clothes from rubbing and irritating the rash.  Wash or change your bed sheets every night while you have the rash.  Have your pet treated by your veterinarian if it has the same infection. To prevent ringworm:   Practice good hygiene.  Wear sandals or shoes in public places and showers.  Do not share personal items with others.  Avoid touching red patches of skin on other people.  Avoid touching pets that have bald spots or wash your hands after doing so. SEEK MEDICAL CARE IF:   Your rash continues to spread after 7 days of treatment.  Your rash is not gone in 4 weeks.  The area around your rash becomes red, warm, tender, and swollen.   This information is not intended to replace advice given to you by your health care provider. Make sure you discuss any questions you have with your health care provider.   Document Released: 03/11/2000 Document Revised: 12/07/2011 Document Reviewed: 09/26/2011 Elsevier Interactive Patient Education [employer]2016 Elsevier Inc.

## 2015-10-08 NOTE — Assessment & Plan Note (Signed)
1) Anticipatory Guidance: Discussed importance of wearing a seatbelt while driving and not texting while driving; changing batteries in smoke detector at least once annually; wearing suntan lotion when outside; eating a balanced and moderate diet; getting physical activity at least 30 minutes per day.  2) Immunizations / Screenings / Labs:  All immunizations are up-to-date per recommendations. Due for a vision screen encouraged to be completed independently. All other screenings are up-to-date per recommendations. Obtain CBC, CMET, and Lipid profile.   Overall well exam with minimal risk factors for cardiovascular disease noted at this time. He is of good weight and previous blood work was all within normal limits. Continue current healthy lifestyle behaviors including nutrition and physical activity. Follow-up prevention exam in 1 year. Follow-up office visit pending blood work if necessary.

## 2015-10-08 NOTE — Progress Notes (Signed)
Subjective:    Patient ID: Greg Cooper, male    DOB: 01-Feb-1976, 40 y.o.   MRN: [id number]  Chief Complaint  Patient presents with  . CPE    not fasting    HPI:  Sterling Mondo is a 40 y.o. male who presents today for an annual wellness visit.   1) Health Maintenance -   Diet - Averages about 3 meals per day consisting of fruits, vegetables, chicken, beef, pork and fish; Caffeine intake 3-4 cups daily  Exercise - 3x per week; primarily resistance training.    2) Preventative Exams / Immunizations:  Dental -- Up to date  Vision -- Due for exam   Health Maintenance  Topic Date Due  . HIV Screening  07/16/1992  . INFLUENZA VACCINE  10/27/2015  . TETANUS/TDAP  04/22/2024    Immunization History  Administered Date(s) Administered  . Tdap 04/22/2014    No Known Allergies   No outpatient prescriptions prior to visit.   No facility-administered medications prior to visit.     History reviewed. No pertinent past medical history.   Past Surgical History  Procedure Laterality Date  . Vasectomy       Family History  Problem Relation Age of Onset  . Leukemia Father      Social History   Social History  . Marital Status: Married    Spouse Name: N/A  . Number of Children: 3  . Years of Education: 18   Occupational History  . [employer]    Social History Main Topics  . Smoking status: Never Smoker   . Smokeless tobacco: Never Used  . Alcohol Use: 2.4 oz/week    4 Cans of beer, 0 Standard drinks or equivalent per week  . Drug Use: No  . Sexual Activity: Not on file   Other Topics Concern  . Not on file   Social History Narrative   Born and raised [place], [place]. Currently resides in a house with his wife and 2 children. 2 dogs. Fun: Softball, gym   Denies religious beliefs that would effect health care.     Review of Systems  Constitutional: Denies fever, chills, fatigue, or significant weight gain/loss. HENT: Head: Denies headache or  neck pain Ears: Denies changes in hearing, ringing in ears, earache, drainage Nose: Denies discharge, stuffiness, itching, nosebleed, sinus pain Throat: Denies sore throat, hoarseness, dry mouth, sores, thrush Eyes: Denies loss/changes in vision, pain, redness, blurry/double vision, flashing lights Cardiovascular: Denies chest pain/discomfort, tightness, palpitations, shortness of breath with activity, difficulty lying down, swelling, sudden awakening with shortness of breath Respiratory: Denies shortness of breath, cough, sputum production, wheezing Gastrointestinal: Denies dysphasia, heartburn, change in appetite, nausea, change in bowel habits, rectal bleeding, constipation, diarrhea, yellow skin or eyes Genitourinary: Denies frequency, urgency, burning/pain, blood in urine, incontinence, change in urinary strength. Musculoskeletal: Denies muscle/joint pain, stiffness, back pain, redness or swelling of joints, trauma Skin: Denies rashes, lumps, itching, dryness, color changes, or hair/nail changes Positive for a rash. Neurological: Denies dizziness, fainting, seizures, weakness, numbness, tingling, tremor Psychiatric - Denies nervousness, stress, depression or memory loss Endocrine: Denies heat or cold intolerance, sweating, frequent urination, excessive thirst, changes in appetite Hematologic: Denies ease of bruising or bleeding     Objective:     BP 120/78 mmHg  Pulse 77  Temp(Src) 98.3 F (36.8 C) (Oral)  Resp 16  Ht 6' (1.829 m)  Wt 194 lb (87.998 kg)  BMI 26.31 kg/m2  SpO2 98% Nursing note and vital signs reviewed.  Physical Exam  Constitutional: He is oriented to person, place, and time. He appears well-developed and well-nourished.  HENT:  Head: Normocephalic.  Right Ear: Hearing, tympanic membrane, external ear and ear canal normal.  Left Ear: Hearing, tympanic membrane, external ear and ear canal normal.  Nose: Nose normal.  Mouth/Throat: Uvula is midline,  oropharynx is clear and moist and mucous membranes are normal.  Eyes: Conjunctivae and EOM are normal. Pupils are equal, round, and reactive to light.  Neck: Neck supple. No JVD present. No tracheal deviation present. No thyromegaly present.  Cardiovascular: Normal rate, regular rhythm, normal heart sounds and intact distal pulses.   Pulmonary/Chest: Effort normal and breath sounds normal.  Abdominal: Soft. Bowel sounds are normal. He exhibits no distension and no mass. There is no tenderness. There is no rebound and no guarding.  Musculoskeletal: Normal range of motion. He exhibits no edema or tenderness.  Lymphadenopathy:    He has no cervical adenopathy.  Neurological: He is alert and oriented to person, place, and time. He has normal reflexes. No cranial nerve deficit. He exhibits normal muscle tone. Coordination normal.  Skin: Skin is warm and dry. Rash noted.  Sporadic red rash that is irregularly shaped with well defined borders and a slightly clear center.   Psychiatric: He has a normal mood and affect. His behavior is normal. Judgment and thought content normal.       Assessment & Plan:   Problem List Items Addressed This Visit      Musculoskeletal and Integument   Rash and nonspecific skin eruption    Rash consistent with tinea corpus. Start ketoconazole. Follow up if symptoms worsen or do not improve.       Relevant Medications   ketoconazole (NIZORAL) 2 % cream     Other   Encounter for general adult medical examination with abnormal findings - Primary    1) Anticipatory Guidance: Discussed importance of wearing a seatbelt while driving and not texting while driving; changing batteries in smoke detector at least once annually; wearing suntan lotion when outside; eating a balanced and moderate diet; getting physical activity at least 30 minutes per day.  2) Immunizations / Screenings / Labs:  All immunizations are up-to-date per recommendations. Due for a vision screen  encouraged to be completed independently. All other screenings are up-to-date per recommendations. Obtain CBC, CMET, and Lipid profile.   Overall well exam with minimal risk factors for cardiovascular disease noted at this time. He is of good weight and previous blood work was all within normal limits. Continue current healthy lifestyle behaviors including nutrition and physical activity. Follow-up prevention exam in 1 year. Follow-up office visit pending blood work if necessary.       Relevant Orders   CBC   Comprehensive metabolic panel   Lipid panel       I am having Mr. Overacker start on ketoconBoyce Mediciazole.   Meds ordered this encounter  Medications  . ketoconazole (NIZORAL) 2 % cream    Sig: Apply 1 application topically daily.    Dispense:  15 g    Refill:  0    Order Specific Question:  Supervising Provider    Answer:  Hillard DankerRAWFORD, ELIZABETH A [[id number]]     Follow-up: Return if symptoms worsen or fail to improve.   Jeanine Luzalone, Babette Stum, FNP

## 2015-11-25 ENCOUNTER — Ambulatory Visit (INDEPENDENT_AMBULATORY_CARE_PROVIDER_SITE_OTHER): Payer: Managed Care, Other (non HMO)

## 2015-11-25 ENCOUNTER — Ambulatory Visit (INDEPENDENT_AMBULATORY_CARE_PROVIDER_SITE_OTHER): Payer: Managed Care, Other (non HMO) | Admitting: Emergency Medicine

## 2015-11-25 VITALS — BP 114/72 | HR 62 | Temp 98.1°F | Resp 16 | Ht 72.0 in | Wt 190.4 lb

## 2015-11-25 DIAGNOSIS — M25512 Pain in left shoulder: Secondary | ICD-10-CM

## 2015-11-25 NOTE — Progress Notes (Signed)
Subjective:  This chart was scribed for [name] MD, by Veverly Fells, at [facility].  This patient was seen in room  2 and the patient's care was started at 12:43 PM.   Chief Complaint  Patient presents with  . Other    left shoulder pain x 2 wks, Pt. declined flu shot     Patient ID: Greg Cooper, male    DOB: 06-26-1975, 40 y.o.   MRN: [id number]  HPI HPI Comments: Greg Cooper is a 40 y.o. male who presents to the [facility] complaining of right shoulder/upper arm pain onset 10 days ago when he was playing with his son and lifted him over his head in a backwards direction while sitting down. Patients son is about 40 pounds. Immediately, he felt a weird sensation in his arm/shoulder and had soreness in the area.  He denies hearing a "pop" at the time of injury. Patient went to exercise 4-5 days ago and felt slight discomfort but it did not stop him from working out. Patient has three children.    Patient Active Problem List   Diagnosis Date Noted  . Encounter for general adult medical examination with abnormal findings 10/08/2015  . Rash and nonspecific skin eruption 10/08/2015  . Low back strain 01/14/2015  . Routine general medical examination at a health care facility 04/22/2014   No past medical history on file. Past Surgical History:  Procedure Laterality Date  . VASECTOMY     No Known Allergies Prior to Admission medications   Medication Sig Start Date End Date Taking? Authorizing Provider  ketoconazole (NIZORAL) 2 % cream Apply 1 application topically daily. Patient not taking: Reported on 11/25/2015 10/08/15   Veryl Speak, FNP   Social History   Social History  . Marital status: Married    Spouse name: N/A  . Number of children: 3  . Years of education: 69   Occupational History  . [employer]    Social History Main Topics  . Smoking status: Never Smoker  . Smokeless tobacco: Never Used  . Alcohol use  2.4 oz/week    4 Cans of beer per week  . Drug use: No  . Sexual activity: Not on file   Other Topics Concern  . Not on file   Social History Narrative   Born and raised [place], [place]. Currently resides in a house with his wife and 2 children. 2 dogs. Fun: Softball, gym   Denies religious beliefs that would effect health care.         Review of Systems  Constitutional: Negative for chills and fever.  Eyes: Negative for pain, redness and itching.  Respiratory: Negative for cough, choking and shortness of breath.   Gastrointestinal: Negative for nausea and vomiting.  Musculoskeletal: Positive for myalgias.  Neurological: Negative for seizures, syncope and speech difficulty.       Objective:   Physical Exam Vitals:   11/25/15 1231  BP: 114/72  Pulse: 62  Resp: 16  Temp: 98.1 F (36.7 C)  TempSrc: Oral  SpO2: 99%  Weight: 190 lb 6.4 oz (86.4 kg)  Height: 6' (1.829 m)    CONSTITUTIONAL: Well developed/well nourished HEAD: Normocephalic/atraumatic EYES: EOMI/PERRL ENMT: Mucous membranes moist NECK: supple no meningeal signs SPINE/BACK:entire spine nontender NEURO: Pt is awake/alert/appropriate, moves all extremitiesx4.  No facial droop.   EXTREMITIES: some very mild anterior shoulder discomfort, full ROM, good internal and external rotation, no rotator cuff weakness.  SKIN:  warm, color normal PSYCH: no abnormalities of mood noted, alert and oriented to situation  No results found. Dg Shoulder Left  Result Date: 11/25/2015 CLINICAL DATA:  Shoulder pain after injury 10 days ago. EXAM: LEFT SHOULDER - 2+ VIEW COMPARISON:  None. FINDINGS: The mineralization and alignment are normal. There is no evidence of acute fracture or dislocation. The subacromial space is preserved. No significant arthropathic changes. IMPRESSION: Negative left shoulder radiographs. Electronically Signed   By: Carey BullocksWilliam  Veazey M.D.   On: 11/25/2015 13:14       Assessment & Plan:  I suspect  patient has a shoulder strain. I did not see any abnormalities in the shoulder joint. His exam was normal. Will treat this with ibuprofen and ice and resistant strengthening with Thera-Band. Recheck if symptoms persist.I personally performed the services described in this documentation, which was scribed in my presence. The recorded information has been reviewed and is accurate.  Collene GobbleSteven A Kunal Levario, MD

## 2015-11-25 NOTE — Patient Instructions (Addendum)
Apply ice to your shoulder twice a day. Take ibuprofen as needed. Use your thera band stretch  for resistance training    IF you received an x-ray today, you will receive an invoice from [facility]. Please contact [facility] at [phone] with questions or concerns regarding your invoice.   IF you received labwork today, you will receive an invoice from [facility]. Please contact [facility] at [phone] with questions or concerns regarding your invoice.   Our billing staff will not be able to assist you with questions regarding bills from these companies.  You will be contacted with the lab results as soon as they are available. The fastest way to get your results is to activate your My Chart account. Instructions are located on the last page of this paperwork. If you have not heard from us regarding the results in 2 weeks, please contact this office.

## 2016-04-05 ENCOUNTER — Encounter: Payer: Self-pay | Admitting: Family

## 2016-04-05 ENCOUNTER — Ambulatory Visit (INDEPENDENT_AMBULATORY_CARE_PROVIDER_SITE_OTHER): Payer: Managed Care, Other (non HMO) | Admitting: Family

## 2016-04-05 DIAGNOSIS — J069 Acute upper respiratory infection, unspecified: Secondary | ICD-10-CM | POA: Diagnosis not present

## 2016-04-05 MED ORDER — PREDNISONE 20 MG PO TABS: 20.0000 mg | ORAL_TABLET | Freq: Two times a day (BID) | ORAL | 0 refills | Status: DC

## 2016-04-05 NOTE — Progress Notes (Signed)
Subjective:    Patient ID: Greg Cooper, male    DOB: [birth year], 41 y.o.   MRN: [id number]  Chief Complaint  Patient presents with  . Cough    x1 week and a half, cough and some congestion    HPI:  Greg Cooper is a 41 y.o. male who  has no past medical history on file. and presents today for an acute office visit.   This is a new problem. Associated symptoms of cough and some congestion that has been going on for about 1.5 weeks. Denies fevers. Modifying factors include Delsym which has not helped very much with his symptoms. Since initial onset he reports that he is feeling better since the initial onset. No recent antibiotics.    No Known Allergies    Outpatient Medications Prior to Visit  Medication Sig Dispense Refill  . ketoconazole (NIZORAL) 2 % cream Apply 1 application topically daily. 15 g 0   No facility-administered medications prior to visit.     Review of Systems  Constitutional: Negative for chills and fever.  HENT: Positive for congestion. Negative for sinus pain, sinus pressure and sore throat.   Respiratory: Positive for cough. Negative for chest tightness, shortness of breath and wheezing.   Neurological: Negative for headaches.      Objective:    BP 118/70 (BP Location: Left Arm, Patient Position: Sitting, Cuff Size: Large)   Pulse 80   Temp 98.3 F (36.8 C) (Oral)   Resp 16   Ht 6' (1.829 m)   Wt 193 lb 12.8 oz (87.9 kg)   SpO2 98%   BMI 26.28 kg/m  Nursing note and vital signs reviewed.  Physical Exam  Constitutional: He is oriented to person, place, and time. He appears well-developed and well-nourished. No distress.  HENT:  Right Ear: Hearing, tympanic membrane, external ear and ear canal normal.  Left Ear: Hearing, tympanic membrane, external ear and ear canal normal.  Nose: Nose normal. Right sinus exhibits no maxillary sinus tenderness and no frontal sinus tenderness. Left sinus exhibits no maxillary sinus tenderness and no frontal  sinus tenderness.  Mouth/Throat: Uvula is midline, oropharynx is clear and moist and mucous membranes are normal.  Cardiovascular: Normal rate, regular rhythm, normal heart sounds and intact distal pulses.   Pulmonary/Chest: Effort normal and breath sounds normal.  Neurological: He is alert and oriented to person, place, and time.  Skin: Skin is warm and dry.  Psychiatric: He has a normal mood and affect. His behavior is normal. Judgment and thought content normal.       Assessment & Plan:   Problem List Items Addressed This Visit      Respiratory   Acute upper respiratory infection    Symptoms and exam consistent with acute upper respiratory infection most likely viral. Start prednisone as needed for cough and wheeze. Continue over-the-counter medications as needed for symptom relief and supportive care. Follow-up if symptoms worsen or do not improve.      Relevant Medications   predniSONE (DELTASONE) 20 MG tablet       I am having [name] start on predniSONE. I am also having him maintain his ketoconazole.   Meds ordered this encounter  Medications  . predniSONE (DELTASONE) 20 MG tablet    Sig: Take 1 tablet (20 mg total) by mouth 2 (two) times daily with a meal.    Dispense:  10 tablet    Refill:  0    Order Specific Question:   Supervising Provider  Answer:   Pricilla Holm A [[id number]]     Follow-up: Return if symptoms worsen or fail to improve.  Mauricio Po, FNP

## 2016-04-05 NOTE — Patient Instructions (Signed)
Thank you for choosing [facility].  SUMMARY AND INSTRUCTIONS:  Medication:  Your prescription(s) have been submitted to your pharmacy or been printed and provided for you. Please take as directed and contact our office if you believe you are having problem(s) with the medication(s) or have any questions.  Follow up:  If your symptoms worsen or fail to improve, please contact our office for further instruction, or in case of emergency go directly to the emergency room at the closest medical facility.   General Recommendations:    Please drink plenty of fluids.  Get plenty of rest   Sleep in humidified air  Use saline nasal sprays  Netti pot   OTC Medications:  Decongestants - helps relieve congestion   Flonase (generic fluticasone) or Nasacort (generic triamcinolone) - please make sure to use the "cross-over" technique at a 45 degree angle towards the opposite eye as opposed to straight up the nasal passageway.   Sudafed (generic pseudoephedrine - Note this is the one that is available behind the pharmacy counter); Products with phenylephrine (-PE) may also be used but is often not as effective as pseudoephedrine.   If you have HIGH BLOOD PRESSURE - Coricidin HBP; AVOID any product that is -D as this contains pseudoephedrine which may increase your blood pressure.  Afrin (oxymetazoline) every 6-8 hours for up to 3 days.   Allergies - helps relieve runny nose, itchy eyes and sneezing   Claritin (generic loratidine), Allegra (fexofenidine), or Zyrtec (generic cyrterizine) for runny nose. These medications should not cause drowsiness.  Note - Benadryl (generic diphenhydramine) may be used however may cause drowsiness  Cough -   Delsym or Robitussin (generic dextromethorphan)  Expectorants - helps loosen mucus to ease removal   Mucinex (generic guaifenesin) as directed on the package.  Headaches / General Aches   Tylenol (generic acetaminophen) - DO NOT  EXCEED 3 grams (3,000 mg) in a 24 hour time period  Advil/Motrin (generic ibuprofen)   Sore Throat -   Salt water gargle   Chloraseptic (generic benzocaine) spray or lozenges / Sucrets (generic dyclonine)       Upper Respiratory Infection, Adult Most upper respiratory infections (URIs) are caused by a virus. A URI affects the nose, throat, and upper air passages. The most common type of URI is often called "the common cold." Follow these instructions at home:  Take medicines only as told by your doctor.  Gargle warm saltwater or take cough drops to comfort your throat as told by your doctor.  Use a warm mist humidifier or inhale steam from a shower to increase air moisture. This may make it easier to breathe.  Drink enough fluid to keep your pee (urine) clear or pale yellow.  Eat soups and other clear broths.  Have a healthy diet.  Rest as needed.  Go back to work when your fever is gone or your doctor says it is okay.  You may need to stay home longer to avoid giving your URI to others.  You can also wear a face mask and wash your hands often to prevent spread of the virus.  Use your inhaler more if you have asthma.  Do not use any tobacco products, including cigarettes, chewing tobacco, or electronic cigarettes. If you need help quitting, ask your doctor. Contact a doctor if:  You are getting worse, not better.  Your symptoms are not helped by medicine.  You have chills.  You are getting more short of breath.  You have brown   or red mucus.  You have yellow or brown discharge from your nose.  You have pain in your face, especially when you bend forward.  You have a fever.  You have puffy (swollen) neck glands.  You have pain while swallowing.  You have white areas in the back of your throat. Get help right away if:  You have very bad or constant:  Headache.  Ear pain.  Pain in your forehead, behind your eyes, and over your cheekbones (sinus  pain).  Chest pain.  You have long-lasting (chronic) lung disease and any of the following:  Wheezing.  Long-lasting cough.  Coughing up blood.  A change in your usual mucus.  You have a stiff neck.  You have changes in your:  Vision.  Hearing.  Thinking.  Mood. This information is not intended to replace advice given to you by your health care provider. Make sure you discuss any questions you have with your health care provider. Document Released: 08/31/2007 Document Revised: 11/15/2015 Document Reviewed: 06/19/2013 Elsevier Interactive Patient Education  2017 Elsevier Inc.  

## 2016-04-05 NOTE — Assessment & Plan Note (Signed)
Symptoms and exam consistent with acute upper respiratory infection most likely viral. Start prednisone as needed for cough and wheeze. Continue over-the-counter medications as needed for symptom relief and supportive care. Follow-up if symptoms worsen or do not improve.

## 2016-10-25 ENCOUNTER — Ambulatory Visit (INDEPENDENT_AMBULATORY_CARE_PROVIDER_SITE_OTHER): Payer: Managed Care, Other (non HMO)

## 2016-10-25 ENCOUNTER — Ambulatory Visit (INDEPENDENT_AMBULATORY_CARE_PROVIDER_SITE_OTHER): Payer: Managed Care, Other (non HMO) | Admitting: Physician Assistant

## 2016-10-25 ENCOUNTER — Encounter: Payer: Self-pay | Admitting: Physician Assistant

## 2016-10-25 VITALS — HR 99 | Temp 99.3°F | Resp 16 | Ht 72.0 in | Wt 187.6 lb

## 2016-10-25 DIAGNOSIS — E860 Dehydration: Secondary | ICD-10-CM

## 2016-10-25 DIAGNOSIS — J189 Pneumonia, unspecified organism: Secondary | ICD-10-CM | POA: Diagnosis not present

## 2016-10-25 DIAGNOSIS — R509 Fever, unspecified: Secondary | ICD-10-CM | POA: Diagnosis not present

## 2016-10-25 DIAGNOSIS — R05 Cough: Secondary | ICD-10-CM

## 2016-10-25 DIAGNOSIS — R059 Cough, unspecified: Secondary | ICD-10-CM

## 2016-10-25 LAB — POCT CBC
GRANULOCYTE PERCENT: 78.8 % (ref 37–80)
HEMATOCRIT: 41.6 % — AB (ref 43.5–53.7)
Hemoglobin: 14.6 g/dL (ref 14.1–18.1)
LYMPH, POC: 1 (ref 0.6–3.4)
MCH, POC: 30.4 pg (ref 27–31.2)
MCHC: 35.2 g/dL (ref 31.8–35.4)
MCV: 86.3 fL (ref 80–97)
MID (CBC): 0.4 (ref 0–0.9)
MPV: 7.1 fL (ref 0–99.8)
PLATELET COUNT, POC: 165 10*3/uL (ref 142–424)
POC GRANULOCYTE: 5.3 (ref 2–6.9)
POC LYMPH %: 14.8 % (ref 10–50)
POC MID %: 6.4 % (ref 0–12)
RBC: 4.82 M/uL (ref 4.69–6.13)
RDW, POC: 12.1 %
WBC: 6.7 10*3/uL (ref 4.6–10.2)

## 2016-10-25 LAB — POCT URINALYSIS DIP (MANUAL ENTRY)
BILIRUBIN UA: NEGATIVE mg/dL
Glucose, UA: NEGATIVE mg/dL
LEUKOCYTES UA: NEGATIVE
NITRITE UA: NEGATIVE
PH UA: 6 (ref 5.0–8.0)
Spec Grav, UA: >=1.03 — AB (ref 1.010–1.025)
Urobilinogen, UA: 1 E.U./dL

## 2016-10-25 MED ORDER — SODIUM CHLORIDE 0.9 % IV BOLUS (SEPSIS)
1000.0000 mL | Freq: Once | INTRAVENOUS | Status: AC
  Administered 2016-10-25: 1000 mL via INTRAVENOUS

## 2016-10-25 MED ORDER — CEFTRIAXONE SODIUM 1 G IJ SOLR
1.0000 g | Freq: Once | INTRAMUSCULAR | Status: AC
  Administered 2016-10-25: 1 g via INTRAMUSCULAR

## 2016-10-25 MED ORDER — AZITHROMYCIN 250 MG PO TABS: ORAL_TABLET | ORAL | 0 refills | Status: DC

## 2016-10-25 MED ORDER — IBUPROFEN 200 MG PO TABS
600.0000 mg | ORAL_TABLET | Freq: Once | ORAL | Status: AC
  Administered 2016-10-25: 600 mg via ORAL

## 2016-10-25 NOTE — Patient Instructions (Addendum)
  800 mg Ibuprofen every 8 hours as needed. Return to the office in 2 days for a recheck.   IF you received an x-ray today, you will receive an invoice from [facility]. Please contact [facility] at [phone] with questions or concerns regarding your invoice.   IF you received labwork today, you will receive an invoice from [facility]. Please contact [facility] at [phone] with questions or concerns regarding your invoice.   Our billing staff will not be able to assist you with questions regarding bills from these companies.  You will be contacted with the lab results as soon as they are available. The fastest way to get your results is to activate your My Chart account. Instructions are located on the last page of this paperwork. If you have not heard from us regarding the results in 2 weeks, please contact this office.

## 2016-10-25 NOTE — Progress Notes (Signed)
10/25/2016 5:31 PM   DOB: Apr 30, 1975 / MRN: [id number]  SUBJECTIVE:  Greg Cooper is a 41 y.o. male presenting for headache and fever that started 3 days ago.  Notes fever as high as 102.  Some night sweats and and shaking.  Cough started a few days ago.  No leg swelling, chest pain, neck pain, rash, tic bites, dysuria, sore throat, vision. Denies drug use.  Just came back from the beach and fell ill.    He has No Known Allergies.   He  has no past medical history on file.    He  reports that he has never smoked. He has never used smokeless tobacco. He reports that he drinks about 2.4 oz of alcohol per week . He reports that he does not use drugs. He  has no sexual activity history on file. The patient  has a past surgical history that includes Vasectomy.  His family history includes Cancer in his father; Leukemia in his father.  Review of Systems  Constitutional: Negative for chills, diaphoresis and fever.  Eyes: Negative.   Respiratory: Positive for cough. Negative for hemoptysis, sputum production, shortness of breath and wheezing.   Cardiovascular: Negative for chest pain, orthopnea and leg swelling.  Gastrointestinal: Negative for abdominal pain, blood in stool, constipation, diarrhea, heartburn, melena, nausea and vomiting.  Genitourinary: Negative for flank pain.  Musculoskeletal: Negative for back pain and neck pain.  Skin: Negative for rash.  Neurological: Negative for dizziness, sensory change, speech change, focal weakness and headaches.    The problem list and medications were reviewed and updated by myself where necessary and exist elsewhere in the encounter.   OBJECTIVE:  Pulse 99   Temp 99.3 F (37.4 C) (Oral)   Resp 16   Ht 6' (1.829 m)   Wt 187 lb 9.6 oz (85.1 kg)   SpO2 97%   BMI 25.44 kg/m   Pulse Readings from Last 3 Encounters:  10/25/16 99  04/05/16 80  11/25/15 62     Physical Exam  Constitutional: He is oriented to person, place, and time. He  appears well-developed. He is active and cooperative.  Non-toxic appearance.  Cardiovascular: Normal rate, regular rhythm, S1 normal, S2 normal, normal heart sounds, intact distal pulses and normal pulses.  Exam reveals no gallop and no friction rub.   No murmur heard. Pulmonary/Chest: Effort normal. No tachypnea. He has no rales.  Abdominal: He exhibits no distension.  Musculoskeletal: He exhibits no edema.  Neurological: He is alert and oriented to person, place, and time.  Skin: Skin is warm and dry. He is not diaphoretic. No pallor.  Vitals reviewed.   Results for orders placed or performed in visit on 10/25/16 (from the past 72 hour(s))  POCT CBC     Status: Abnormal   Collection Time: 10/25/16  4:45 PM  Result Value Ref Range   WBC 6.7 4.6 - 10.2 K/uL   Lymph, poc 1.0 0.6 - 3.4   POC LYMPH PERCENT 14.8 10 - 50 %L   MID (cbc) 0.4 0 - 0.9   POC MID % 6.4 0 - 12 %M   POC Granulocyte 5.3 2 - 6.9   Granulocyte percent 78.8 37 - 80 %G   RBC 4.82 4.69 - 6.13 M/uL   Hemoglobin 14.6 14.1 - 18.1 g/dL   HCT, POC 41.6 (A) 43.5 - 53.7 %   MCV 86.3 80 - 97 fL   MCH, POC 30.4 27 - 31.2 pg   MCHC 35.2  31.8 - 35.4 g/dL   RDW, POC 12.1 %   Platelet Count, POC 165 142 - 424 K/uL   MPV 7.1 0 - 99.8 fL  POCT urinalysis dipstick     Status: Abnormal   Collection Time: 10/25/16  4:46 PM  Result Value Ref Range   Color, UA yellow yellow   Clarity, UA clear clear   Glucose, UA negative negative mg/dL   Bilirubin, UA small (A) negative   Ketones, POC UA negative negative mg/dL   Spec Grav, UA >=1.030 (A) 1.010 - 1.025   Blood, UA trace-lysed (A) negative   pH, UA 6.0 5.0 - 8.0   Protein Ur, POC =100 (A) negative mg/dL   Urobilinogen, UA 1.0 0.2 or 1.0 E.U./dL   Nitrite, UA Negative Negative   Leukocytes, UA Negative Negative    Dg Chest 2 View  Result Date: 10/25/2016 CLINICAL DATA:  Febrile illness. EXAM: CHEST  2 VIEW COMPARISON:  No prior . FINDINGS: Mediastinum hilar structures  normal. Left lower lobe infiltrate consistent pneumonia. Heart size normal. No pleural effusion or pneumothorax. IMPRESSION: Left lower lobe infiltrate consistent with pneumonia. Follow-up exam suggested to demonstrate clearing. Electronically Signed   By: Marcello Moores  Register   On: 10/25/2016 16:52    ASSESSMENT AND PLAN:  Greg Cooper was seen today for fever.  Diagnoses and all orders for this visit:  Febrile illness, acute: -     POCT urinalysis dipstick -     DG Chest 2 View; Future -     POCT CBC -     CMP14+EGFR -     PSA -     Insert peripheral IV -     sodium chloride 0.9 % bolus 1,000 mL; Inject 1,000 mLs into the vein once.  Cough: See problem two.   Community acquired pneumonia, unspecified laterality: Ceftiraxone here and azithromycin 500 for three day.  RTC in 2-3 days, sooner if not improving.  -     cefTRIAXone (ROCEPHIN) injection 1 g; Inject 1 g into the muscle once. -     azithromycin (ZITHROMAX) 250 MG tablet; Take two tabs daily with food.  Dehydration -     sodium chloride 0.9 % bolus 1,000 mL; Inject 1,000 mLs into the vein once.    The patient is advised to call or return to clinic if he does not see an improvement in symptoms, or to seek the care of the closest emergency department if he worsens with the above plan.   Philis Fendt, MHS, PA-C [facility] 10/25/2016 5:31 PM

## 2016-10-26 ENCOUNTER — Other Ambulatory Visit: Payer: Self-pay

## 2016-10-26 ENCOUNTER — Telehealth: Payer: Self-pay | Admitting: Physician Assistant

## 2016-10-26 LAB — CMP14+EGFR
ALBUMIN: 4.4 g/dL (ref 3.5–5.5)
ALK PHOS: 47 IU/L (ref 39–117)
ALT: 11 IU/L (ref 0–44)
AST: 19 IU/L (ref 0–40)
Albumin/Globulin Ratio: 1.5 (ref 1.2–2.2)
BILIRUBIN TOTAL: 1.3 mg/dL — AB (ref 0.0–1.2)
BUN/Creatinine Ratio: 14 (ref 9–20)
BUN: 17 mg/dL (ref 6–20)
CO2: 23 mmol/L (ref 20–29)
CREATININE: 1.22 mg/dL (ref 0.76–1.27)
Calcium: 9.2 mg/dL (ref 8.7–10.2)
Chloride: 97 mmol/L (ref 96–106)
GFR, EST AFRICAN AMERICAN: 86 mL/min/{1.73_m2} (ref >59)
GFR, EST NON AFRICAN AMERICAN: 74 mL/min/{1.73_m2} (ref >59)
GLUCOSE: 113 mg/dL — AB (ref 65–99)
Globulin, Total: 3 g/dL (ref 1.5–4.5)
Potassium: 4.1 mmol/L (ref 3.5–5.2)
Sodium: 137 mmol/L (ref 134–144)
Total Protein: 7.4 g/dL (ref 6.0–8.5)

## 2016-10-26 LAB — PSA: PROSTATE SPECIFIC AG, SERUM: 0.3 ng/mL (ref 0.0–4.0)

## 2016-10-26 MED ORDER — BENZONATATE 200 MG PO CAPS: 200.0000 mg | ORAL_CAPSULE | Freq: Three times a day (TID) | ORAL | 0 refills | Status: DC | PRN

## 2016-10-26 NOTE — Telephone Encounter (Signed)
Tessalon pearls 200 mg q8 #30. Call me if he is allergic. Thank you Sherese. He is contagious for the next 24 hours.

## 2016-10-26 NOTE — Telephone Encounter (Signed)
Greg Cooper, would you like rx for cough sent in for this patient? Also questioning possibility to transfer sx to his family. Please advise, thank you/ S.Lief Palmatier,CMA

## 2016-10-26 NOTE — Telephone Encounter (Signed)
Returned call to patient, spoke with his wife. Patient wife verbalized understanding to information and medication, thanked office for the assistance./ S.Jamera Vanloan,CMA

## 2016-10-26 NOTE — Telephone Encounter (Signed)
Pt is calling about getting a cough medication and is he contagious   Best number [phone]

## 2016-10-27 ENCOUNTER — Encounter (HOSPITAL_COMMUNITY): Payer: Self-pay | Admitting: Emergency Medicine

## 2016-10-27 ENCOUNTER — Emergency Department (HOSPITAL_COMMUNITY)
Admission: EM | Admit: 2016-10-27 | Discharge: 2016-10-28 | Disposition: A | Discharge status: Home / Self Care | Payer: Managed Care, Other (non HMO) | Attending: Emergency Medicine | Admitting: Emergency Medicine

## 2016-10-27 ENCOUNTER — Emergency Department (HOSPITAL_COMMUNITY): Payer: Managed Care, Other (non HMO)

## 2016-10-27 ENCOUNTER — Ambulatory Visit (INDEPENDENT_AMBULATORY_CARE_PROVIDER_SITE_OTHER): Payer: Managed Care, Other (non HMO) | Admitting: Physician Assistant

## 2016-10-27 ENCOUNTER — Encounter: Payer: Self-pay | Admitting: Physician Assistant

## 2016-10-27 VITALS — BP 108/62 | HR 81 | Temp 99.4°F | Resp 16 | Ht 72.0 in | Wt 190.0 lb

## 2016-10-27 DIAGNOSIS — R0602 Shortness of breath: Secondary | ICD-10-CM | POA: Insufficient documentation

## 2016-10-27 DIAGNOSIS — J181 Lobar pneumonia, unspecified organism: Secondary | ICD-10-CM | POA: Insufficient documentation

## 2016-10-27 DIAGNOSIS — R51 Headache: Secondary | ICD-10-CM | POA: Diagnosis present

## 2016-10-27 DIAGNOSIS — J189 Pneumonia, unspecified organism: Secondary | ICD-10-CM | POA: Insufficient documentation

## 2016-10-27 DIAGNOSIS — R61 Generalized hyperhidrosis: Secondary | ICD-10-CM | POA: Diagnosis not present

## 2016-10-27 DIAGNOSIS — R05 Cough: Secondary | ICD-10-CM | POA: Insufficient documentation

## 2016-10-27 HISTORY — DX: Pneumonia, unspecified organism: J18.9

## 2016-10-27 LAB — COMPREHENSIVE METABOLIC PANEL
ALBUMIN: 3.2 g/dL — AB (ref 3.5–5.0)
ALK PHOS: 43 U/L (ref 38–126)
ALT: 27 U/L (ref 17–63)
AST: 36 U/L (ref 15–41)
Anion gap: 7 (ref 5–15)
BUN: 12 mg/dL (ref 6–20)
CALCIUM: 8.5 mg/dL — AB (ref 8.9–10.3)
CHLORIDE: 100 mmol/L — AB (ref 101–111)
CO2: 26 mmol/L (ref 22–32)
CREATININE: 1.18 mg/dL (ref 0.61–1.24)
GFR calc non Af Amer: >60 mL/min (ref >60)
GLUCOSE: 126 mg/dL — AB (ref 65–99)
Potassium: 3.8 mmol/L (ref 3.5–5.1)
SODIUM: 133 mmol/L — AB (ref 135–145)
Total Bilirubin: 1.1 mg/dL (ref 0.3–1.2)
Total Protein: 6.5 g/dL (ref 6.5–8.1)

## 2016-10-27 LAB — CBC WITH DIFFERENTIAL/PLATELET
BASOS PCT: 0 %
Basophils Absolute: 0 10*3/uL (ref 0.0–0.1)
EOS ABS: 0 10*3/uL (ref 0.0–0.7)
Eosinophils Relative: 0 %
HCT: 37.9 % — ABNORMAL LOW (ref 39.0–52.0)
HEMOGLOBIN: 13.2 g/dL (ref 13.0–17.0)
LYMPHS ABS: 1 10*3/uL (ref 0.7–4.0)
Lymphocytes Relative: 22 %
MCH: 29.9 pg (ref 26.0–34.0)
MCHC: 34.8 g/dL (ref 30.0–36.0)
MCV: 85.7 fL (ref 78.0–100.0)
MONO ABS: 0.3 10*3/uL (ref 0.1–1.0)
MONOS PCT: 6 %
Neutro Abs: 3.2 10*3/uL (ref 1.7–7.7)
Neutrophils Relative %: 72 %
Platelets: 168 10*3/uL (ref 150–400)
RBC: 4.42 MIL/uL (ref 4.22–5.81)
RDW: 12.1 % (ref 11.5–15.5)
WBC: 4.5 10*3/uL (ref 4.0–10.5)

## 2016-10-27 LAB — URINALYSIS, ROUTINE W REFLEX MICROSCOPIC
BACTERIA UA: NONE SEEN
Bilirubin Urine: NEGATIVE
GLUCOSE, UA: NEGATIVE mg/dL
Ketones, ur: NEGATIVE mg/dL
Leukocytes, UA: NEGATIVE
NITRITE: NEGATIVE
PROTEIN: 100 mg/dL — AB
Specific Gravity, Urine: 1.026 (ref 1.005–1.030)
Squamous Epithelial / LPF: NONE SEEN
pH: 5 (ref 5.0–8.0)

## 2016-10-27 LAB — POCT URINALYSIS DIP (MANUAL ENTRY)
GLUCOSE UA: NEGATIVE mg/dL
LEUKOCYTES UA: NEGATIVE
NITRITE UA: NEGATIVE
Protein Ur, POC: 100 mg/dL — AB
RBC UA: NEGATIVE
Spec Grav, UA: 1.015 (ref 1.010–1.025)
UROBILINOGEN UA: 4 U/dL — AB
pH, UA: 6.5 (ref 5.0–8.0)

## 2016-10-27 LAB — CBC

## 2016-10-27 LAB — I-STAT CG4 LACTIC ACID, ED: LACTIC ACID, VENOUS: 1.06 mmol/L (ref 0.5–1.9)

## 2016-10-27 MED ORDER — ACETAMINOPHEN 325 MG PO TABS
650.0000 mg | ORAL_TABLET | Freq: Once | ORAL | Status: AC | PRN
  Administered 2016-10-27: 650 mg via ORAL

## 2016-10-27 MED ORDER — CEFTRIAXONE SODIUM 1 G IJ SOLR: 1.0000 g | INTRAMUSCULAR | Status: DC

## 2016-10-27 MED ORDER — SODIUM CHLORIDE 0.9 % IV BOLUS (SEPSIS)
1000.0000 mL | Freq: Once | INTRAVENOUS | Status: AC
  Administered 2016-10-27: 1000 mL via INTRAVENOUS

## 2016-10-27 MED ORDER — DEXTROSE 5 % IV SOLN: 1.0000 g | INTRAVENOUS | Status: DC

## 2016-10-27 MED ORDER — CEFTRIAXONE SODIUM 1 G IJ SOLR
1.0000 g | INTRAMUSCULAR | Status: AC
  Administered 2016-10-27: 1 g via INTRAMUSCULAR

## 2016-10-27 MED ORDER — CEFTRIAXONE SODIUM 1 G IJ SOLR: 1.0000 g | Freq: Once | INTRAMUSCULAR | 0 refills | Status: DC

## 2016-10-27 MED ORDER — ACETAMINOPHEN 325 MG PO TABS
ORAL_TABLET | ORAL | Status: AC
  Filled 2016-10-27: qty 2

## 2016-10-27 NOTE — ED Triage Notes (Signed)
Pt c/o headache beginning today. States he was seen by his PCP previously this week and diagnosed with pneumonia. Pt has received two doses of IV antibiotics and has been taking oral antibiotics. Headache worse with coughing.

## 2016-10-27 NOTE — Patient Instructions (Addendum)
Come back anytime if you are feeling worse.  If we are not open then go to the ED.  If you are not necessarily continuing to improve then come back next Monday.  I'd like to see you back in about 2-3 weeks for a repeat chest xray to confirm resolution of pneumonia.     IF you received an x-ray today, you will receive an invoice from [facility]. Please contact [facility] at [phone] with questions or concerns regarding your invoice.   IF you received labwork today, you will receive an invoice from [facility]. Please contact [facility] at [phone] with questions or concerns regarding your invoice.   Our billing staff will not be able to assist you with questions regarding bills from these companies.  You will be contacted with the lab results as soon as they are available. The fastest way to get your results is to activate your My Chart account. Instructions are located on the last page of this paperwork. If you have not heard from us regarding the results in 2 weeks, please contact this office.

## 2016-10-27 NOTE — Progress Notes (Signed)
10/27/2016 1:06 PM   DOB: February 16, 1976 / MRN: [id number]  SUBJECTIVE:  Greg Cooper is a 41 y.o. male presenting for recheck of lobar pneumonia.  Tells me that he feels roughly 60% better. Complains of worsening cough however and continues to feeling overwhelmingly fatigued.  Denies chest pain and SOB at this time.  Admits that he has not been hydrating very well and has not urinated today.   He has No Known Allergies.   He  has no past medical history on file.    He  reports that he has never smoked. He has never used smokeless tobacco. He reports that he drinks about 2.4 oz of alcohol per week . He reports that he does not use drugs. He  has no sexual activity history on file. The patient  has a past surgical history that includes Vasectomy.  His family history includes Cancer in his father; Leukemia in his father.  Review of Systems  Constitutional: Negative for chills, diaphoresis and fever.  Eyes: Negative.   Respiratory: Negative for cough, hemoptysis, sputum production, shortness of breath and wheezing.   Cardiovascular: Negative for chest pain, orthopnea and leg swelling.  Gastrointestinal: Negative for nausea.  Skin: Negative for rash.  Neurological: Negative for dizziness, sensory change, speech change, focal weakness and headaches.    The problem list and medications were reviewed and updated by myself where necessary and exist elsewhere in the encounter.   OBJECTIVE:  BP 108/62 (BP Location: Right Arm, Patient Position: Sitting, Cuff Size: Normal)   Pulse 81   Temp 99.4 F (37.4 C) (Oral)   Resp 16   Ht 6' (1.829 m)   Wt 190 lb (86.2 kg)   SpO2 95%   BMI 25.77 kg/m    Wt Readings from Last 3 Encounters:  10/27/16 190 lb (86.2 kg)  10/25/16 187 lb 9.6 oz (85.1 kg)  04/05/16 193 lb 12.8 oz (87.9 kg)   Temp Readings from Last 3 Encounters:  10/27/16 99.4 F (37.4 C) (Oral)  10/25/16 99.3 F (37.4 C) (Oral)  04/05/16 98.3 F (36.8 C) (Oral)   BP Readings  from Last 3 Encounters:  10/27/16 108/62  04/05/16 118/70  11/25/15 114/72   Pulse Readings from Last 3 Encounters:  10/27/16 81  10/25/16 99  04/05/16 80    Physical Exam  Constitutional: He is oriented to person, place, and time. He appears well-developed and well-nourished.  Cardiovascular: Normal rate, regular rhythm, normal heart sounds and intact distal pulses.   Pulmonary/Chest: Effort normal. No respiratory distress. He has no wheezes. He has rales. He exhibits no tenderness.  Neurological: He is alert and oriented to person, place, and time. No cranial nerve deficit. Coordination normal.    Results for orders placed or performed in visit on 10/27/16 (from the past 72 hour(s))  POCT urinalysis dipstick     Status: Abnormal   Collection Time: 10/27/16 10:35 AM  Result Value Ref Range   Color, UA yellow yellow   Clarity, UA clear clear   Glucose, UA negative negative mg/dL   Bilirubin, UA small (A) negative   Ketones, POC UA trace (5) (A) negative mg/dL   Spec Grav, UA 1.015 1.010 - 1.025   Blood, UA negative negative   pH, UA 6.5 5.0 - 8.0   Protein Ur, POC =100 (A) negative mg/dL   Urobilinogen, UA 4.0 (A) 0.2 or 1.0 E.U./dL   Nitrite, UA Negative Negative   Leukocytes, UA Negative Negative    No results  found.  ASSESSMENT AND PLAN:  Greg Cooper was seen today for follow-up.  Diagnoses and all orders for this visit:  Community acquired pneumonia of left lower lobe of lung [facility]): He tells me he feels 60% better today.  He is not ill appearing.  Rales now in the left lower lobe. Continues to have cough and HA.  Given proteinuria and ketones will hydrate here and given problem 2 will add a second dose of ceftriaxone IM.  Will plan to see him back in about 2-3 days if not improving and most certainly in about 3 weeks for chest rads.  -     POCT urinalysis dipstick -     CBC -     CMP14+EGFR -     Insert peripheral IV -     sodium chloride 0.9 % bolus 1,000 mL; Inject  1,000 mLs into the vein once. -     Discontinue: cefTRIAXone (ROCEPHIN) 1 g in dextrose 5 % 50 mL IVPB; Inject 1 g into the vein daily. -     Discontinue: cefTRIAXone (ROCEPHIN) 1 g injection; Inject 1 g into the muscle once. -     Discontinue: cefTRIAXone (ROCEPHIN) injection 1 g; Inject 1 g into the muscle daily. -     cefTRIAXone (ROCEPHIN) injection 1 g; Inject 1 g into the muscle now.  Diaphoresis -     Discontinue: cefTRIAXone (ROCEPHIN) 1 g in dextrose 5 % 50 mL IVPB; Inject 1 g into the vein daily.    The patient is advised to call or return to clinic if he does not see an improvement in symptoms, or to seek the care of the closest emergency department if he worsens with the above plan.   Philis Fendt, MHS, PA-C Primary Care at [facility] 10/27/2016 1:06 PM

## 2016-10-27 NOTE — ED Notes (Signed)
Patient transported to X-ray 

## 2016-10-28 LAB — CMP14+EGFR
A/G RATIO: 1.4 (ref 1.2–2.2)
ALK PHOS: 46 IU/L (ref 39–117)
ALT: 17 IU/L (ref 0–44)
AST: 28 IU/L (ref 0–40)
Albumin: 3.8 g/dL (ref 3.5–5.5)
BILIRUBIN TOTAL: 1 mg/dL (ref 0.0–1.2)
BUN/Creatinine Ratio: 15 (ref 9–20)
BUN: 16 mg/dL (ref 6–20)
CALCIUM: 8.5 mg/dL — AB (ref 8.7–10.2)
CHLORIDE: 97 mmol/L (ref 96–106)
CO2: 25 mmol/L (ref 20–29)
Creatinine, Ser: 1.06 mg/dL (ref 0.76–1.27)
GFR calc Af Amer: 102 mL/min/{1.73_m2} (ref >59)
GFR calc non Af Amer: 88 mL/min/{1.73_m2} (ref >59)
Globulin, Total: 2.8 g/dL (ref 1.5–4.5)
Glucose: 108 mg/dL — ABNORMAL HIGH (ref 65–99)
POTASSIUM: 4.1 mmol/L (ref 3.5–5.2)
Sodium: 136 mmol/L (ref 134–144)
Total Protein: 6.6 g/dL (ref 6.0–8.5)

## 2016-10-28 MED ORDER — KETOROLAC TROMETHAMINE 30 MG/ML IJ SOLN
30.0000 mg | Freq: Once | INTRAMUSCULAR | Status: AC
  Administered 2016-10-28: 30 mg via INTRAVENOUS
  Filled 2016-10-28: qty 1

## 2016-10-28 MED ORDER — SODIUM CHLORIDE 0.9 % IV BOLUS (SEPSIS)
1000.0000 mL | Freq: Once | INTRAVENOUS | Status: AC
  Administered 2016-10-28: 1000 mL via INTRAVENOUS

## 2016-10-28 MED ORDER — LEVOFLOXACIN IN D5W 750 MG/150ML IV SOLN
750.0000 mg | Freq: Once | INTRAVENOUS | Status: AC
  Administered 2016-10-28: 750 mg via INTRAVENOUS
  Filled 2016-10-28: qty 150

## 2016-10-28 MED ORDER — LEVOFLOXACIN 750 MG PO TABS: ORAL_TABLET | ORAL | 0 refills | Status: DC

## 2016-10-28 MED ORDER — HYDROCOD POLST-CPM POLST ER 10-8 MG/5ML PO SUER
5.0000 mL | Freq: Once | ORAL | Status: AC
  Administered 2016-10-28: 5 mL via ORAL
  Filled 2016-10-28: qty 5

## 2016-10-28 MED ORDER — HYDROCOD POLST-CPM POLST ER 10-8 MG/5ML PO SUER: 5.0000 mL | Freq: Every evening | ORAL | 0 refills | Status: DC | PRN

## 2016-10-28 NOTE — ED Provider Notes (Signed)
[facility]-EMERGENCY DEPT Provider Note   CSN: [id number] Arrival date & time: 10/27/16  2104     History   Chief Complaint Chief Complaint  Patient presents with  . Headache    HPI Greg Cooper is a 41 y.o. male.  The history is provided by the patient and the spouse.  Cough  This is a new problem. The current episode started more than 2 days ago. The problem occurs every few minutes. The problem has been gradually worsening. The cough is non-productive. Associated symptoms include chills, sweats, headaches and shortness of breath. Pertinent negatives include no sore throat. He is not a smoker.   Pt without any medical conditions presents with ongoing symptoms of pneumonia Symptoms started over 4 days ago with chills/HA/cough Seen by PCP and diagnosed with pneumonia Given rocephin/zpack Seen on 8/2 for ongoing symptoms and had repeat dose of rocephin and nearly completed zpack He still continues to have fever/HA/cough He reports mild SOB No vomiting He has had some diarrhea No rash No foreign travel No tick bites/rash He is a nonsmoker No chest pain No abdominal pain Past Medical History:  Diagnosis Date  . Pneumonia     Patient Active Problem List   Diagnosis Date Noted  . CAP (community acquired pneumonia) 10/27/2016    Past Surgical History:  Procedure Laterality Date  . VASECTOMY         Home Medications    Prior to Admission medications   Medication Sig Start Date End Date Taking? Authorizing Provider  azithromycin (ZITHROMAX) 250 MG tablet Take two tabs daily with food. 10/25/16   Ofilia Neaslark, Michael L, PA-C  benzonatate (TESSALON) 200 MG capsule Take 1 capsule (200 mg total) by mouth every 8 (eight) hours as needed for cough. 10/26/16   Ofilia Neaslark, Michael L, PA-C  ketoconazole (NIZORAL) 2 % cream Apply 1 application topically daily. Patient not taking: Reported on 10/27/2016 10/08/15   Veryl Speakalone, Gregory D, FNP  predniSONE (DELTASONE) 20 MG tablet Take 1 tablet (20 mg  total) by mouth 2 (two) times daily with a meal. Patient not taking: Reported on 10/25/2016 04/05/16   Veryl Speakalone, Gregory D, FNP    Family History Family History  Problem Relation Age of Onset  . Leukemia Father   . Cancer Father        Leukemia    Social History Social History  Substance Use Topics  . Smoking status: Never Smoker  . Smokeless tobacco: Never Used  . Alcohol use 2.4 oz/week    4 Cans of beer per week     Allergies   Patient has no known allergies.   Review of Systems Review of Systems  Constitutional: Positive for chills and fever.  HENT: Negative for sore throat.   Respiratory: Positive for cough and shortness of breath.   Gastrointestinal: Positive for diarrhea. Negative for vomiting.  Musculoskeletal: Negative for neck stiffness.  Skin: Negative for rash.  Neurological: Positive for headaches.  All other systems reviewed and are negative.    Physical Exam Updated Vital Signs BP (!) 113/59   Pulse 88   Temp (!) 103 F (39.4 C) (Oral)   Resp 16   Ht 1.829 m (6')   Wt 86.2 kg (190 lb)   SpO2 94%   BMI 25.77 kg/m   Physical Exam CONSTITUTIONAL: Well developed/well nourished HEAD: Normocephalic/atraumatic EYES: EOMI/PERRL ENMT: Mucous membranes moist, uvula midline, no erythema/exudates NECK: supple no meningeal signs SPINE/BACK:entire spine nontender CV: S1/S2 noted, no murmurs/rubs/gallops noted LUNGS:crackles in left base, no  apparent distress ABDOMEN: soft, nontender, no rebound or guarding, bowel sounds noted throughout abdomen GU:no cva tenderness NEURO: Pt is awake/alert/appropriate, moves all extremitiesx4.  No facial droop.   EXTREMITIES: pulses normal/equal, full ROM SKIN: warm, color normal, no rash PSYCH: no abnormalities of mood noted, alert and oriented to situation   ED Treatments / Results  Labs (all labs ordered are listed, but only abnormal results are displayed) Labs Reviewed  COMPREHENSIVE METABOLIC PANEL - Abnormal;  Notable for the following:       Result Value   Sodium 133 (*)    Chloride 100 (*)    Glucose, Bld 126 (*)    Calcium 8.5 (*)    Albumin 3.2 (*)    All other components within normal limits  CBC WITH DIFFERENTIAL/PLATELET - Abnormal; Notable for the following:    HCT 37.9 (*)    All other components within normal limits  URINALYSIS, ROUTINE W REFLEX MICROSCOPIC - Abnormal; Notable for the following:    APPearance HAZY (*)    Hgb urine dipstick SMALL (*)    Protein, ur 100 (*)    All other components within normal limits  I-STAT CG4 LACTIC ACID, ED    EKG  EKG Interpretation None       Radiology Dg Chest 2 View  Result Date: 10/27/2016 CLINICAL DATA:  Headache beginning today. Seen by primary care physician previously and diagnosed with pneumonia. Patient initially felt better on antibiotics and now feeling worse. Fever. Nonsmoker. EXAM: CHEST  2 VIEW COMPARISON:  10/25/2016 FINDINGS: Left lower lobe consolidation consistent with pneumonia demonstrating progression since previous study. Small left pleural effusion has developed. Right lung is clear. Heart size and pulmonary vascularity are normal. No pneumothorax. IMPRESSION: Progression of left lower lobe pneumonia with new small left pleural effusion. Electronically Signed   By: Burman Nieves M.D.   On: 10/27/2016 23:35    Procedures Procedures (including critical care time)  Medications Ordered in ED Medications  acetaminophen (TYLENOL) tablet 650 mg (650 mg Oral Given 10/27/16 2253)  levofloxacin (LEVAQUIN) IVPB 750 mg (0 mg Intravenous Stopped 10/28/16 0208)  sodium chloride 0.9 % bolus 1,000 mL (0 mLs Intravenous Stopped 10/28/16 0223)  ketorolac (TORADOL) 30 MG/ML injection 30 mg (30 mg Intravenous Given 10/28/16 0032)  chlorpheniramine-HYDROcodone (TUSSIONEX) 10-8 MG/5ML suspension 5 mL (5 mLs Oral Given 10/28/16 0030)     Initial Impression / Assessment and Plan / ED Course  I have reviewed the triage vital signs and the  nursing notes.  Pertinent labs & imaging results that were available during my care of the patient were reviewed by me and considered in my medical decision making (see chart for details).     1:09 AM Pt with worsening findings of pneumonia on CXR No signs of meningitis He denies tick bites Will give levaquin due to zpack treatment failure Will monitor 3:13 AM Pt improved BP (!) 113/59   Pulse 88   Temp (!) 103 F (39.4 C) (Oral)   Resp 16   Ht 1.829 m (6')   Wt 86.2 kg (190 lb)   SpO2 94%   BMI 25.77 kg/m  He is awake/alert No hypoxia or tachypnea on ambulation He felt well while ambulating Will d/c home on levaquin We discussed at length need for return for any shortness of breath He already has PCP followup arranged on 10/31/16  Final Clinical Impressions(s) / ED Diagnoses   Final diagnoses:  Community acquired pneumonia of left lower lobe of lung (HCC)  New Prescriptions New Prescriptions   CHLORPHENIRAMINE-HYDROCODONE (TUSSIONEX PENNKINETIC ER) 10-8 MG/5ML SUER    Take 5 mLs by mouth at bedtime as needed for cough.   LEVOFLOXACIN (LEVAQUIN) 750 MG TABLET    X 6 days     Zadie RhineWickline, Carmina Walle, MD 10/28/16 [phone]

## 2016-10-28 NOTE — ED Notes (Signed)
Pt able to maintain SpO2 levels at or above 92% throughout ambulation. Became mildly tachycardic near end of ambulation, but denied any pain, discomfort, or SOB.

## 2016-10-28 NOTE — ED Notes (Signed)
Pt ambulated around department. Pt's SpO2 levels averaged 94%. Pt remains coughing. Pt denies any sob, dizziness, lightheadedness, or pain.

## 2016-10-31 ENCOUNTER — Ambulatory Visit (INDEPENDENT_AMBULATORY_CARE_PROVIDER_SITE_OTHER): Payer: Managed Care, Other (non HMO) | Admitting: Physician Assistant

## 2016-10-31 ENCOUNTER — Ambulatory Visit (INDEPENDENT_AMBULATORY_CARE_PROVIDER_SITE_OTHER): Payer: Managed Care, Other (non HMO)

## 2016-10-31 ENCOUNTER — Encounter: Payer: Self-pay | Admitting: Physician Assistant

## 2016-10-31 VITALS — BP 106/71 | HR 73 | Temp 97.9°F | Resp 18 | Ht 72.0 in | Wt 188.4 lb

## 2016-10-31 DIAGNOSIS — J181 Lobar pneumonia, unspecified organism: Secondary | ICD-10-CM

## 2016-10-31 DIAGNOSIS — J189 Pneumonia, unspecified organism: Secondary | ICD-10-CM

## 2016-10-31 NOTE — Progress Notes (Signed)
10/31/2016 10:15 AM   DOB: May 09, 1975 / MRN: [id number]  SUBJECTIVE:  Greg Cooper is a 41 y.o. male presenting for recheck of pneumonia. I had previously placed him on azithromycin 500 mg daily for three days and had placed 2 grams of ceftriaxone IM.  He had seemed to be improving until his wife called that after hours line and advised that he had a fever of 102 and seemed to be having some tachypnea.  He went to the ED and was given IV levaquin and images there showed that his pneumonia had progressed.  He was started on PO levaquin and come back today for recheck. Tells me that he has been pushing fluids and his appetite has no come back.   He has No Known Allergies.   He  has a past medical history of Pneumonia.    He  reports that he has never smoked. He has never used smokeless tobacco. He reports that he drinks about 2.4 oz of alcohol per week . He reports that he does not use drugs. He  has no sexual activity history on file. The patient  has a past surgical history that includes Vasectomy.  His family history includes Cancer in his father; Leukemia in his father.  Review of Systems  Constitutional: Negative for chills and fever.  Respiratory: Negative for cough and hemoptysis.   Gastrointestinal: Negative for nausea.  Musculoskeletal: Negative for myalgias.  Skin: Negative for itching and rash.  Neurological: Negative for dizziness.  Psychiatric/Behavioral: Negative for depression.    The problem list and medications were reviewed and updated by myself where necessary and exist elsewhere in the encounter.   OBJECTIVE:  BP 106/71 (BP Location: Right Arm, Patient Position: Sitting, Cuff Size: Normal)   Pulse 73   Temp 97.9 F (36.6 C) (Oral)   Resp 18   Ht 6' (1.829 m)   Wt 188 lb 6.4 oz (85.5 kg)   SpO2 98%   BMI 25.55 kg/m   Wt Readings from Last 3 Encounters:  10/31/16 188 lb 6.4 oz (85.5 kg)  10/27/16 190 lb (86.2 kg)  10/27/16 190 lb (86.2 kg)   BP Readings  from Last 3 Encounters:  10/31/16 106/71  10/28/16 122/68  10/27/16 108/62   Lab Results  Component Value Date   ALT 27 10/27/2016   AST 36 10/27/2016   ALKPHOS 43 10/27/2016   BILITOT 1.1 10/27/2016     Physical Exam  Constitutional: He appears well-developed. He is active and cooperative.  Non-toxic appearance.  Cardiovascular: Normal rate, regular rhythm, S1 normal, S2 normal, normal heart sounds, intact distal pulses and normal pulses.  Exam reveals no gallop and no friction rub.   No murmur heard. Pulmonary/Chest: Effort normal. No tachypnea. He has no rales.  Abdominal: He exhibits no distension.  Musculoskeletal: He exhibits no edema.  Neurological: He is alert.  Skin: Skin is warm and dry. He is not diaphoretic. No pallor.  Vitals reviewed.   No results found for this or any previous visit (from the past 72 hour(s)).  Dg Chest 2 View  Result Date: 10/31/2016 CLINICAL DATA:  Patient diagnosed with pneumonia the week of 10/24/2016. Currently receiving antibiotics. EXAM: CHEST  2 VIEW COMPARISON:  PA and lateral chest 10/27/2016 and 10/25/2016. FINDINGS: Left lower lobe airspace disease consistent with pneumonia persists but appears mildly improved compared to the most recent examination. The right lung is clear. No pneumothorax or pleural fluid. Heart size is normal. No bony abnormality. IMPRESSION: Mild  improvement in left lower lobe pneumonia compared to the most recent exam. No new abnormality. Electronically Signed   By: Drusilla Kannerhomas  Dalessio M.D.   On: 10/31/2016 09:33    ASSESSMENT AND PLAN:  Greg JohnBrian was seen today for pneumonia and follow-up.  Diagnoses and all orders for this visit:  Community acquired pneumonia of left lower lobe of lung (HCC): Rads show improvement.  Advised we stay the course.  Any other exacerbation in symptoms and he will need to go back to the ED as he will likely be admitted for IV abx given multidrug resistance.  -     DG Chest 2 View;  Future    The patient is advised to call or return to clinic if he does not see an improvement in symptoms, or to seek the care of the closest emergency department if he worsens with the above plan.   Deliah BostonMichael Virgel Haro, MHS, PA-C Primary Care at [facility] 10/31/2016 10:15 AM

## 2016-10-31 NOTE — Patient Instructions (Addendum)
Call if you need refills. Go back to the hospital if you seem to get worse at any point.     IF you received an x-ray today, you will receive an invoice from [facility]. Please contact [facility] at [phone] with questions or concerns regarding your invoice.   IF you received labwork today, you will receive an invoice from [facility]. Please contact [facility] at [phone] with questions or concerns regarding your invoice.   Our billing staff will not be able to assist you with questions regarding bills from these companies.  You will be contacted with the lab results as soon as they are available. The fastest way to get your results is to activate your My Chart account. Instructions are located on the last page of this paperwork. If you have not heard from us regarding the results in 2 weeks, please contact this office.

## 2016-11-11 ENCOUNTER — Ambulatory Visit (INDEPENDENT_AMBULATORY_CARE_PROVIDER_SITE_OTHER): Payer: Managed Care, Other (non HMO) | Admitting: Physician Assistant

## 2016-11-11 ENCOUNTER — Encounter: Payer: Self-pay | Admitting: Physician Assistant

## 2016-11-11 ENCOUNTER — Ambulatory Visit (INDEPENDENT_AMBULATORY_CARE_PROVIDER_SITE_OTHER): Payer: Managed Care, Other (non HMO)

## 2016-11-11 VITALS — BP 118/76 | HR 73 | Resp 16 | Ht 72.0 in | Wt 184.0 lb

## 2016-11-11 DIAGNOSIS — Z23 Encounter for immunization: Secondary | ICD-10-CM

## 2016-11-11 DIAGNOSIS — J181 Lobar pneumonia, unspecified organism: Secondary | ICD-10-CM

## 2016-11-11 DIAGNOSIS — J189 Pneumonia, unspecified organism: Secondary | ICD-10-CM

## 2016-11-11 DIAGNOSIS — Z8619 Personal history of other infectious and parasitic diseases: Secondary | ICD-10-CM

## 2016-11-11 NOTE — Patient Instructions (Addendum)
Dg Chest 2 View  Result Date: 11/11/2016 CLINICAL DATA:  Pneumonia EXAM: CHEST  2 VIEW COMPARISON:  10/31/2016 FINDINGS: Continued improvement in the left lower lobe retrocardiac airspace process compatible with resolving pneumonia. Right lung remains clear. Normal heart size and vascularity. Trachea is midline. Negative for edema, effusion or pneumothorax. No acute osseous finding. IMPRESSION: Improving left lower lobe pneumonia.  No new finding. Electronically Signed   By: Judie Petit.  Shick M.D.   On: 11/11/2016 08:26    No need for further medications.  The pneumonia has resolved by my exam.  Will be happy to see you back if you need anything in the future.  Will see you back in 1 year for a pneumonia booster.     IF you received an x-ray today, you will receive an invoice from [facility]. Please contact [facility] at [phone] with questions or concerns regarding your invoice.   IF you received labwork today, you will receive an invoice from [facility]. Please contact [facility] at [phone] with questions or concerns regarding your invoice.   Our billing staff will not be able to assist you with questions regarding bills from these companies.  You will be contacted with the lab results as soon as they are available. The fastest way to get your results is to activate your My Chart account. Instructions are located on the last page of this paperwork. If you have not heard from [place] regarding the results in 2 weeks, please contact this office.

## 2016-11-11 NOTE — Progress Notes (Signed)
    11/11/2016 8:57 AM   DOB: 06/12/1975 / MRN: [id number]  SUBJECTIVE:  Greg Cooper is a 41 y.o. male presenting for follow up of pneumonia.  Tells me he feels great today and denies fever, chills, nausea, chest pain, SOB.    He has No Known Allergies.   He  has a past medical history of Pneumonia.    He  reports that he has never smoked. He has never used smokeless tobacco. He reports that he drinks about 2.4 oz of alcohol per week . He reports that he does not use drugs. He  has no sexual activity history on file. The patient  has a past surgical history that includes Vasectomy.  His family history includes Cancer in his father; Leukemia in his father.  Review of Systems  Constitutional: Negative for chills and fever.  Respiratory: Negative for cough, hemoptysis, sputum production, shortness of breath and wheezing.   Cardiovascular: Negative for chest pain and leg swelling.  Skin: Negative for rash.  Neurological: Negative for dizziness.    The problem list and medications were reviewed and updated by myself where necessary and exist elsewhere in the encounter.   OBJECTIVE:  BP 118/76 (BP Location: Right Arm, Patient Position: Sitting, Cuff Size: Normal)   Pulse 73   Resp 16   Ht 6' (1.829 m)   Wt 184 lb (83.5 kg)   SpO2 98%   BMI 24.95 kg/m   Physical Exam  Constitutional: He appears well-developed. He is active and cooperative.  Non-toxic appearance.  Cardiovascular: Normal rate, regular rhythm, S1 normal, S2 normal, normal heart sounds, intact distal pulses and normal pulses.  Exam reveals no gallop and no friction rub.   No murmur heard. Pulmonary/Chest: Effort normal. No stridor. No tachypnea. No respiratory distress. He has no wheezes. He has no rales.  Abdominal: He exhibits no distension.  Musculoskeletal: He exhibits no edema.  Neurological: He is alert.  Skin: Skin is warm and dry. He is not diaphoretic. No pallor.  Vitals reviewed.   No results found for  this or any previous visit (from the past 72 hour(s)).  Dg Chest 2 View  Result Date: 11/11/2016 CLINICAL DATA:  Pneumonia EXAM: CHEST  2 VIEW COMPARISON:  10/31/2016 FINDINGS: Continued improvement in the left lower lobe retrocardiac airspace process compatible with resolving pneumonia. Right lung remains clear. Normal heart size and vascularity. Trachea is midline. Negative for edema, effusion or pneumothorax. No acute osseous finding. IMPRESSION: Improving left lower lobe pneumonia.  No new finding. Electronically Signed   By: Judie Petit.  Shick M.D.   On: 11/11/2016 08:26    ASSESSMENT AND PLAN:  Elchanan was seen today for follow-up.  Diagnoses and all orders for this visit:  Pneumonia of left lower lobe due to infectious organism [facility]): Resolved with levaquin 750 x 7 doses.   -     Pneumococcal conjugate vaccine 13-valent IM -     Care order/instruction:  History of multidrug resistant Streptococcus pneumoniae infection -     DG Chest 2 View; Future    The patient is advised to call or return to clinic if he does not see an improvement in symptoms, or to seek the care of the closest emergency department if he worsens with the above plan.   Deliah Boston, MHS, PA-C Primary Care at [facility] 11/11/2016 8:57 AM

## 2017-05-26 ENCOUNTER — Ambulatory Visit: Payer: Managed Care, Other (non HMO) | Admitting: Physician Assistant

## 2017-05-26 ENCOUNTER — Encounter: Payer: Self-pay | Admitting: Physician Assistant

## 2017-05-26 ENCOUNTER — Other Ambulatory Visit: Payer: Self-pay

## 2017-05-26 ENCOUNTER — Ambulatory Visit (INDEPENDENT_AMBULATORY_CARE_PROVIDER_SITE_OTHER): Payer: Managed Care, Other (non HMO)

## 2017-05-26 VITALS — BP 116/78 | HR 63 | Temp 97.9°F | Resp 16 | Ht 72.0 in | Wt 197.2 lb

## 2017-05-26 DIAGNOSIS — R05 Cough: Secondary | ICD-10-CM | POA: Diagnosis not present

## 2017-05-26 DIAGNOSIS — Z8701 Personal history of pneumonia (recurrent): Secondary | ICD-10-CM

## 2017-05-26 DIAGNOSIS — R059 Cough, unspecified: Secondary | ICD-10-CM

## 2017-05-26 LAB — POCT CBC
Granulocyte percent: 66.7 %G (ref 37–80)
HEMATOCRIT: 45.5 % (ref 43.5–53.7)
HEMOGLOBIN: 15.4 g/dL (ref 14.1–18.1)
Lymph, poc: 2.1 (ref 0.6–3.4)
MCH, POC: 30.6 pg (ref 27–31.2)
MCHC: 33.8 g/dL (ref 31.8–35.4)
MCV: 90.5 fL (ref 80–97)
MID (CBC): 0.5 (ref 0–0.9)
MPV: 6.9 fL (ref 0–99.8)
POC GRANULOCYTE: 5.1 (ref 2–6.9)
POC LYMPH PERCENT: 26.9 %L (ref 10–50)
POC MID %: 6.4 % (ref 0–12)
Platelet Count, POC: 252 10*3/uL (ref 142–424)
RBC: 5.03 M/uL (ref 4.69–6.13)
RDW, POC: 12.2 %
WBC: 7.7 10*3/uL (ref 4.6–10.2)

## 2017-05-26 MED ORDER — AZITHROMYCIN 250 MG PO TABS: ORAL_TABLET | ORAL | 0 refills | Status: DC

## 2017-05-26 NOTE — Progress Notes (Signed)
05/26/2017 1:57 PM   DOB: [birth year] / MRN: [id number]  SUBJECTIVE:  Greg Cooper is a 42 y.o. male presenting for cough.  This was proceeded by nasal congestion. No fever, chills, nausea.  Has a history of left lower lobe pneumonia resistant to typical treatments. He did get the flu shot this year and I did give him the pneumovax last year.   Immunization History  Administered Date(s) Administered  . Pneumococcal Conjugate-13 11/11/2016  . Tdap 04/22/2014   He has No Known Allergies.   He  has a past medical history of Pneumonia.    He  reports that  has never smoked. he has never used smokeless tobacco. He reports that he drinks about 2.4 oz of alcohol per week. He reports that he does not use drugs. He  has no sexual activity history on file. The patient  has a past surgical history that includes Vasectomy.  His family history includes Cancer in his father; Leukemia in his father.  Review of Systems  Constitutional: Negative for chills, diaphoresis and fever.  Respiratory: Positive for cough and sputum production. Negative for hemoptysis and wheezing.   Cardiovascular: Negative for chest pain.  Gastrointestinal: Negative for nausea.  Skin: Negative for rash.  Neurological: Negative for dizziness.    The problem list and medications were reviewed and updated by myself where necessary and exist elsewhere in the encounter.   OBJECTIVE:  BP 116/78 (BP Location: Right Arm, Patient Position: Sitting, Cuff Size: Normal)   Pulse 63   Temp 97.9 F (36.6 C) (Oral)   Resp 16   Ht 6' (1.829 m)   Wt 197 lb 3.2 oz (89.4 kg)   SpO2 98%   BMI 26.75 kg/m   Lab Results  Component Value Date   WBC 7.7 05/26/2017   HGB 15.4 05/26/2017   HCT 45.5 05/26/2017   MCV 90.5 05/26/2017   PLT 168 10/27/2016     Physical Exam  Constitutional: He is oriented to person, place, and time. He appears well-developed. He is active and cooperative.  Non-toxic appearance.  Eyes: EOM are normal.  Pupils are equal, round, and reactive to light.  Cardiovascular: Normal rate, regular rhythm, S1 normal, S2 normal, normal heart sounds, intact distal pulses and normal pulses. Exam reveals no gallop and no friction rub.  No murmur heard. Pulmonary/Chest: Effort normal. No stridor. No tachypnea. No respiratory distress. He has no wheezes. He has no rales.  Abdominal: He exhibits no distension.  Musculoskeletal: He exhibits no edema, tenderness or deformity.  Neurological: He is alert and oriented to person, place, and time. He has normal strength and normal reflexes. He is not disoriented. No cranial nerve deficit or sensory deficit. He exhibits normal muscle tone. Coordination and gait normal.  Skin: Skin is warm and dry. He is not diaphoretic. No pallor.  Psychiatric: His behavior is normal.  Vitals reviewed.   Results for orders placed or performed in visit on 05/26/17 (from the past 72 hour(s))  POCT CBC     Status: None   Collection Time: 05/26/17 12:04 PM  Result Value Ref Range   WBC 7.7 4.6 - 10.2 K/uL   Lymph, poc 2.1 0.6 - 3.4   POC LYMPH PERCENT 26.9 10 - 50 %L   MID (cbc) 0.5 0 - 0.9   POC MID % 6.4 0 - 12 %M   POC Granulocyte 5.1 2 - 6.9   Granulocyte percent 66.7 37 - 80 %G   RBC 5.03 4.69 -  6.13 M/uL   Hemoglobin 15.4 14.1 - 18.1 g/dL   HCT, POC 81.145.5 91.443.5 - 53.7 %   MCV 90.5 80 - 97 fL   MCH, POC 30.6 27 - 31.2 pg   MCHC 33.8 31.8 - 35.4 g/dL   RDW, POC 78.212.2 %   Platelet Count, POC 252 142 - 424 K/uL   MPV 6.9 0 - 99.8 fL    Dg Chest 2 View  Result Date: 05/26/2017 CLINICAL DATA:  Cough. EXAM: CHEST  2 VIEW COMPARISON:  11/11/2016. FINDINGS: Mediastinum hilar structures normal. Lungs are clear. No pleural effusion or pneumothorax. Heart size normal. Chest is unchanged from 11/11/2016. IMPRESSION: No acute cardiopulmonary disease. Electronically Signed   By: Maisie Fushomas  Register   On: 05/26/2017 12:03    ASSESSMENT AND PLAN:  Greg Cooper was seen today for  cough.  Diagnoses and all orders for this visit:  Cough: Given problem 2 I hesitate not to cover him with a pneumonia antibiotic.  I am going to treat him for community-acquired pneumonia as this may just be early in the course. -     POCT CBC -     DG Chest 2 View -     azithromycin (ZITHROMAX) 250 MG tablet; Take two on day one and one daily thereafter.  History of pneumonia: He failed a Z-Pak about 6 months ago and was seen in the ED and started on Levaquin and admission was considered at that time.    The patient is advised to call or return to clinic if he does not see an improvement in symptoms, or to seek the care of the closest emergency department if he worsens with the above plan.   Greg Cooper, MHS, PA-C [facility] 05/26/2017 1:57 PM

## 2018-04-16 IMAGING — DX DG CHEST 2V
3 series · 3 of 3 positions shown · non-contrast
Comparison: 10/31/2016

CLINICAL DATA: Pneumonia

EXAM:
CHEST  2 VIEW

[chest pa (1 of 2)]
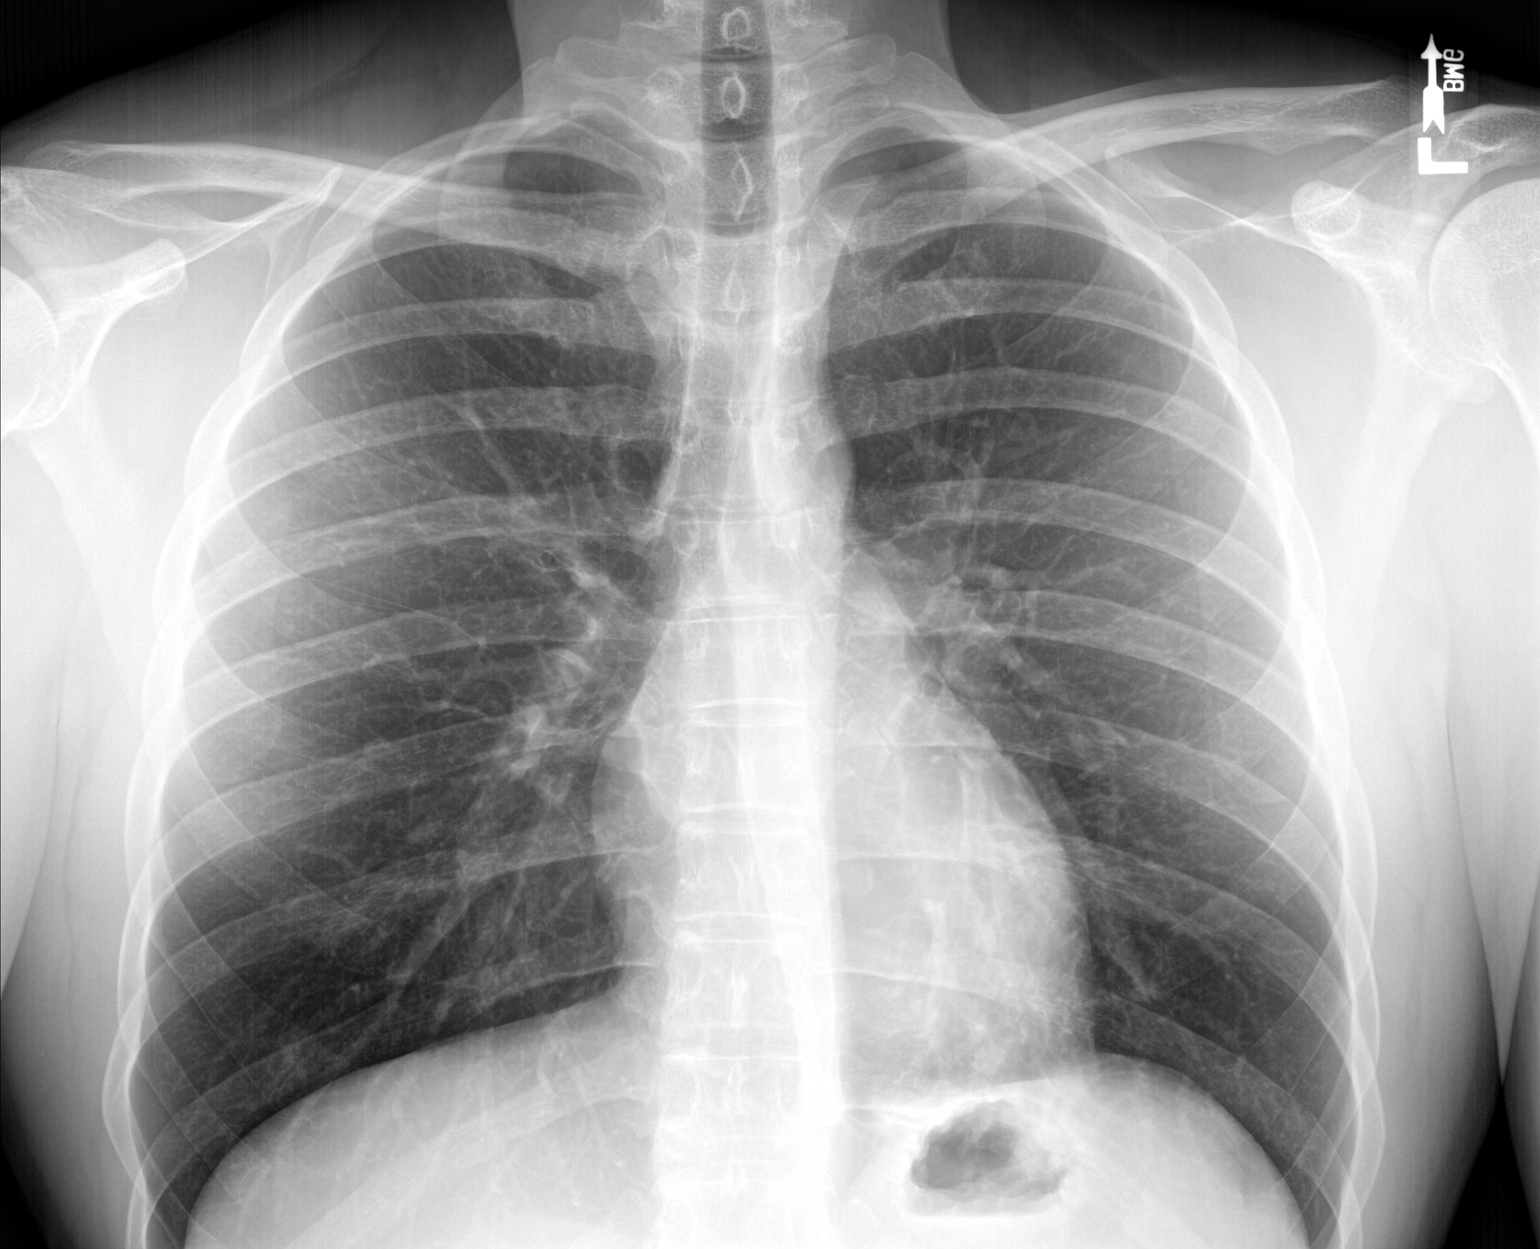

[chest lat]
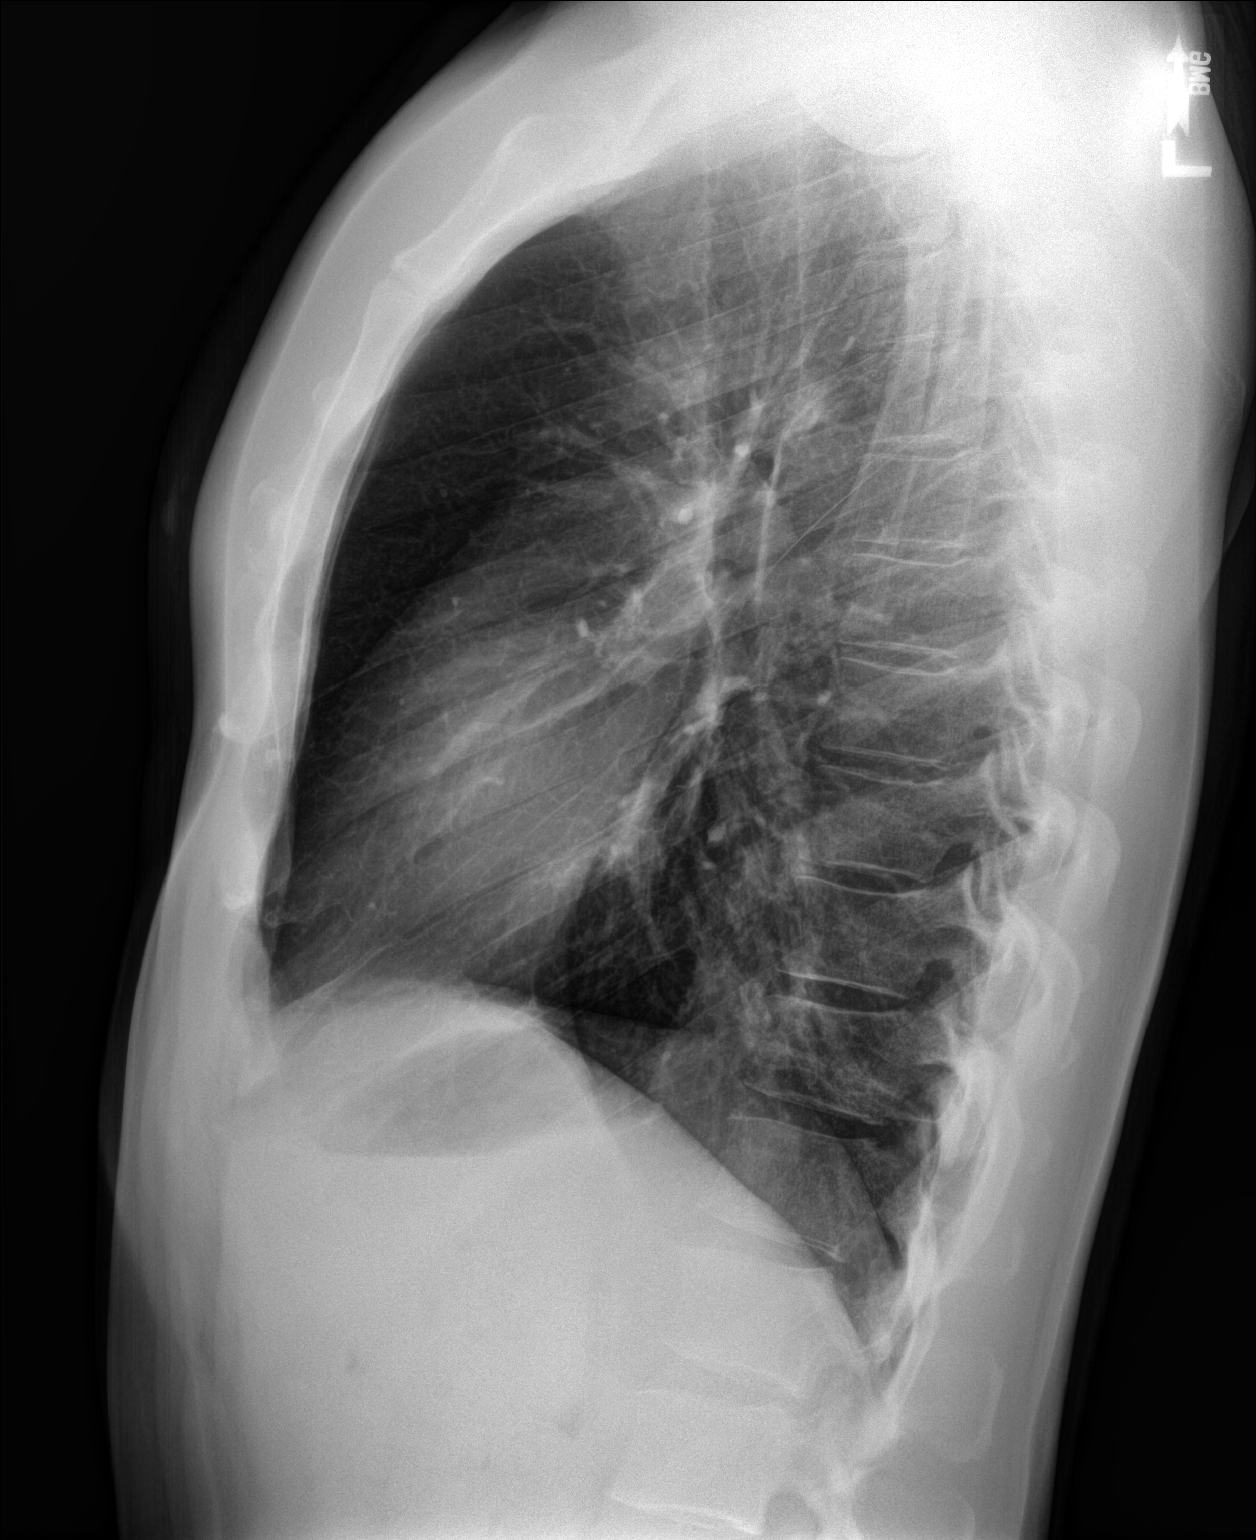

[chest pa (2 of 2)]
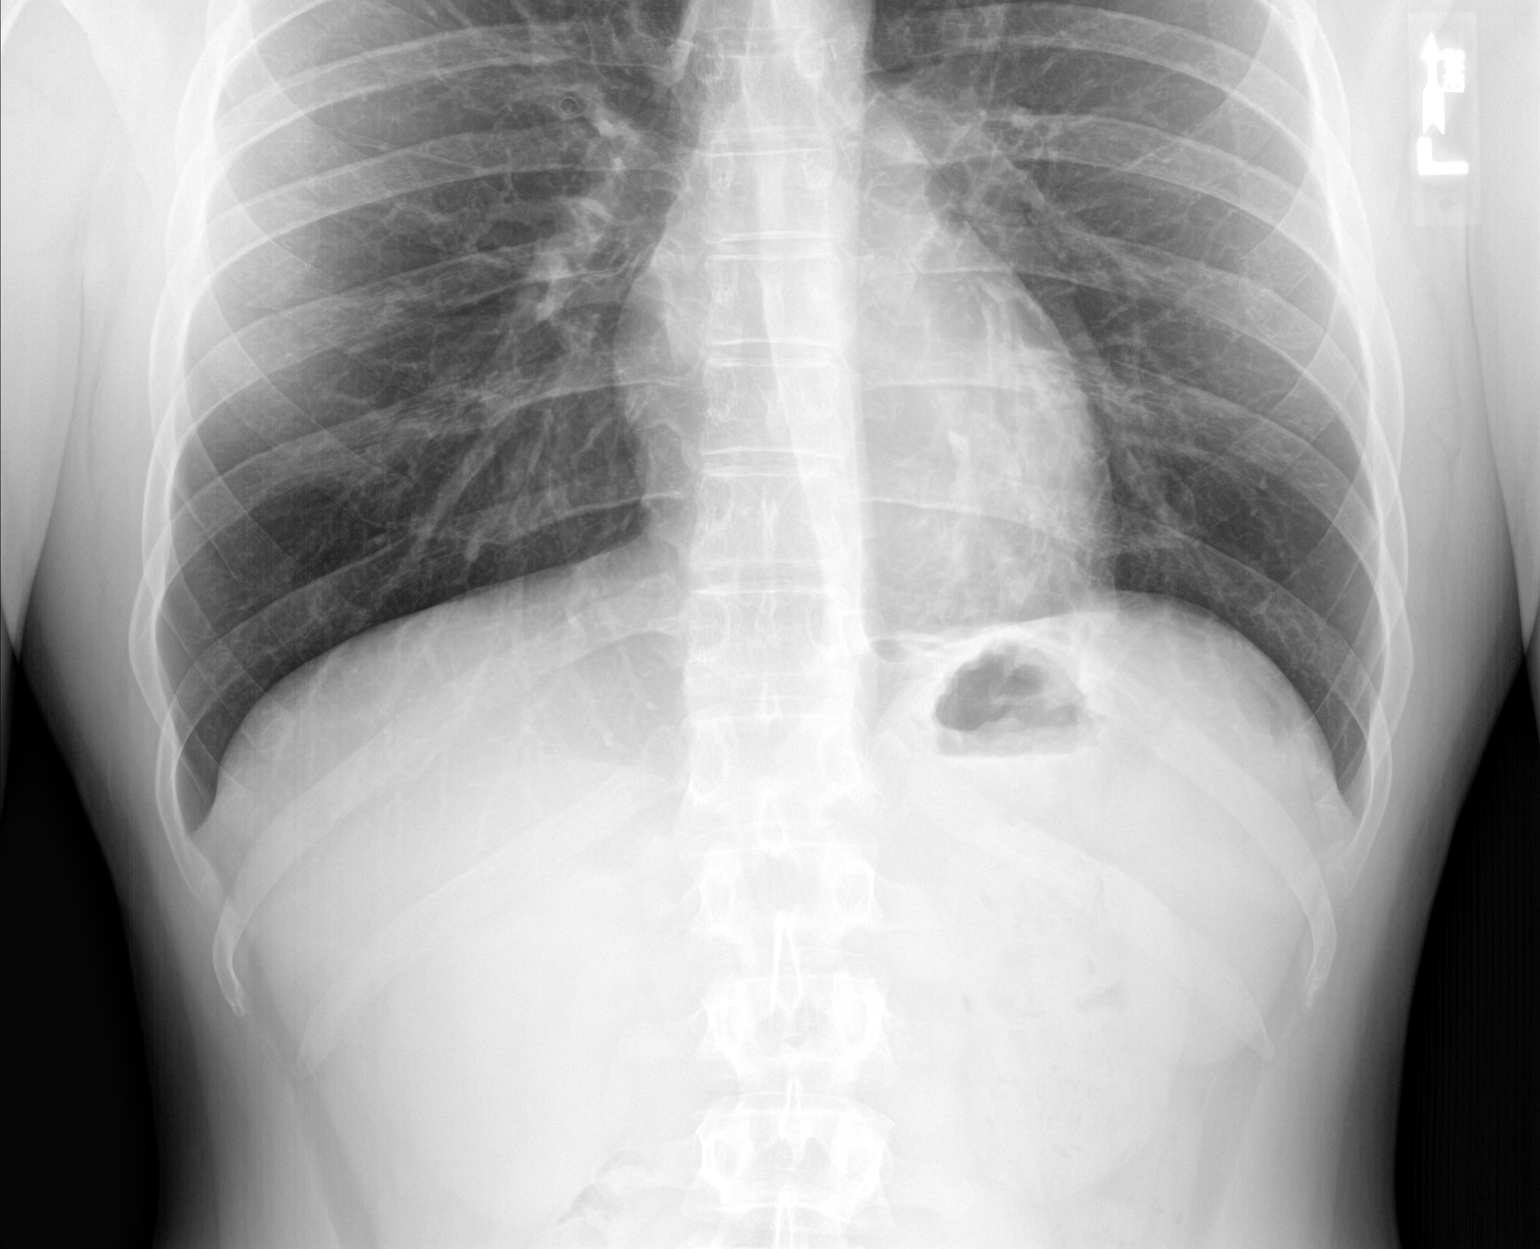

[3 of 3 positions shown; findings below may reference images not displayed]

FINDINGS: Continued improvement in the left lower lobe retrocardiac airspace
process compatible with resolving pneumonia. Right lung remains
clear. Normal heart size and vascularity. Trachea is midline.
Negative for edema, effusion or pneumothorax. No acute osseous
finding.
IMPRESSION: Improving left lower lobe pneumonia.  No new finding.

## 2018-04-26 ENCOUNTER — Ambulatory Visit (INDEPENDENT_AMBULATORY_CARE_PROVIDER_SITE_OTHER): Payer: Managed Care, Other (non HMO) | Admitting: Family Medicine

## 2018-04-26 ENCOUNTER — Encounter: Payer: Self-pay | Admitting: Family Medicine

## 2018-04-26 VITALS — BP 110/66 | HR 74 | Temp 97.7°F | Ht 72.0 in | Wt 196.4 lb

## 2018-04-26 DIAGNOSIS — Z131 Encounter for screening for diabetes mellitus: Secondary | ICD-10-CM

## 2018-04-26 DIAGNOSIS — Z13220 Encounter for screening for lipoid disorders: Secondary | ICD-10-CM | POA: Diagnosis not present

## 2018-04-26 DIAGNOSIS — Z6826 Body mass index (BMI) 26.0-26.9, adult: Secondary | ICD-10-CM

## 2018-04-26 DIAGNOSIS — Z0001 Encounter for general adult medical examination with abnormal findings: Secondary | ICD-10-CM | POA: Diagnosis not present

## 2018-04-26 DIAGNOSIS — E785 Hyperlipidemia, unspecified: Secondary | ICD-10-CM | POA: Insufficient documentation

## 2018-04-26 LAB — LIPID PANEL
CHOLESTEROL: 172 mg/dL (ref 0–200)
HDL: 36.9 mg/dL — ABNORMAL LOW (ref >39.00)
LDL CALC: 118 mg/dL — AB (ref 0–99)
NonHDL: 134.9
TRIGLYCERIDES: 86 mg/dL (ref 0.0–149.0)
Total CHOL/HDL Ratio: 5
VLDL: 17.2 mg/dL (ref 0.0–40.0)

## 2018-04-26 LAB — HEMOGLOBIN A1C: Hgb A1c MFr Bld: 4.9 % (ref 4.6–6.5)

## 2018-04-26 NOTE — Progress Notes (Signed)
Please inform patient of the following:  His "bad" cholesterol was a bit high and his "good" cholesterol was a bit low. His blood sugar level was normal. Would like for him to continuing to work on diet and exercise and we can recheck in a year or so.  Katina Degree. [name], MD 04/26/2018 12:54 PM

## 2018-04-26 NOTE — Patient Instructions (Signed)
It was very nice to see you today!  Keep up the good work!  We will check blood work today.  Please come back see me in 1 year for your next check-in, or sooner as needed.  Take care, Dr Jerline Pain   Preventive Care 40-64 Years, Male Preventive care refers to lifestyle choices and visits with your health care provider that can promote health and wellness. What does preventive care include?   A yearly physical exam. This is also called an annual well check.  Dental exams once or twice a year.  Routine eye exams. Ask your health care provider how often you should have your eyes checked.  Personal lifestyle choices, including: ? Daily care of your teeth and gums. ? Regular physical activity. ? Eating a healthy diet. ? Avoiding tobacco and drug use. ? Limiting alcohol use. ? Practicing safe sex. ? Taking low-dose aspirin every day starting at age 35. What happens during an annual well check? The services and screenings done by your health care provider during your annual well check will depend on your age, overall health, lifestyle risk factors, and family history of disease. Counseling Your health care provider may ask you questions about your:  Alcohol use.  Tobacco use.  Drug use.  Emotional well-being.  Home and relationship well-being.  Sexual activity.  Eating habits.  Work and work Statistician. Screening You may have the following tests or measurements:  Height, weight, and BMI.  Blood pressure.  Lipid and cholesterol levels. These may be checked every 5 years, or more frequently if you are over 41 years old.  Skin check.  Lung cancer screening. You may have this screening every year starting at age 89 if you have a 30-pack-year history of smoking and currently smoke or have quit within the past 15 years.  Colorectal cancer screening. All adults should have this screening starting at age 10 and continuing until age 56. Your health care provider may  recommend screening at age 31. You will have tests every 1-10 years, depending on your results and the type of screening test. People at increased risk should start screening at an earlier age. Screening tests may include: ? Guaiac-based fecal occult blood testing. ? Fecal immunochemical test (FIT). ? Stool DNA test. ? Virtual colonoscopy. ? Sigmoidoscopy. During this test, a flexible tube with a tiny camera (sigmoidoscope) is used to examine your rectum and lower colon. The sigmoidoscope is inserted through your anus into your rectum and lower colon. ? Colonoscopy. During this test, a long, thin, flexible tube with a tiny camera (colonoscope) is used to examine your entire colon and rectum.  Prostate cancer screening. Recommendations will vary depending on your family history and other risks.  Hepatitis C blood test.  Hepatitis B blood test.  Sexually transmitted disease (STD) testing.  Diabetes screening. This is done by checking your blood sugar (glucose) after you have not eaten for a while (fasting). You may have this done every 1-3 years. Discuss your test results, treatment options, and if necessary, the need for more tests with your health care provider. Vaccines Your health care provider may recommend certain vaccines, such as:  Influenza vaccine. This is recommended every year.  Tetanus, diphtheria, and acellular pertussis (Tdap, Td) vaccine. You may need a Td booster every 10 years.  Varicella vaccine. You may need this if you have not been vaccinated.  Zoster vaccine. You may need this after age 63.  Measles, mumps, and rubella (MMR) vaccine. You may need at  least one dose of MMR if you were born in 1957 or later. You may also need a second dose.  Pneumococcal 13-valent conjugate (PCV13) vaccine. You may need this if you have certain conditions and have not been vaccinated.  Pneumococcal polysaccharide (PPSV23) vaccine. You may need one or two doses if you smoke  cigarettes or if you have certain conditions.  Meningococcal vaccine. You may need this if you have certain conditions.  Hepatitis A vaccine. You may need this if you have certain conditions or if you travel or work in places where you may be exposed to hepatitis A.  Hepatitis B vaccine. You may need this if you have certain conditions or if you travel or work in places where you may be exposed to hepatitis B.  Haemophilus influenzae type b (Hib) vaccine. You may need this if you have certain risk factors. Talk to your health care provider about which screenings and vaccines you need and how often you need them. This information is not intended to replace advice given to you by your health care provider. Make sure you discuss any questions you have with your health care provider. Document Released: 04/10/2015 Document Revised: 05/04/2017 Document Reviewed: 01/13/2015 Elsevier Interactive Patient Education  2019 [employer].

## 2018-04-26 NOTE — Progress Notes (Signed)
Subjective:  Greg Cooper is a 43 y.o. male who presents today for his annual comprehensive physical exam and to transfer care to this office.   HPI:  He has no acute complaints today.   Lifestyle Diet: No specific diets. Exercise: Goes to gym 3 times per week.   Depression screen PHQ 2/9 04/26/2018  Decreased Interest 0  Down, Depressed, Hopeless 0  PHQ - 2 Score 0   Health Maintenance Due  Topic Date Due  . HIV Screening  07/16/1992     ROS: A complete review of systems was negative.   PMH:  The following were reviewed and entered/updated in epic: Past Medical History:  Diagnosis Date  . Pneumonia    There are no active problems to display for this patient.  Past Surgical History:  Procedure Laterality Date  . VASECTOMY      Family History  Problem Relation Age of Onset  . Leukemia Father   . Cancer Father        Leukemia  . Hypertension Father   . Colon cancer Neg Hx   . Prostate cancer Neg Hx     Medications- reviewed and updated No current outpatient medications on file.   No current facility-administered medications for this visit.     Allergies-reviewed and updated No Known Allergies  Social History   Socioeconomic History  . Marital status: Married    Spouse name: Not on file  . Number of children: 3  . Years of education: 33  . Highest education level: Not on file  Occupational History  . Occupation: Programmer, applications  . Financial resource strain: Not on file  . Food insecurity:    Worry: Not on file    Inability: Not on file  . Transportation needs:    Medical: Not on file    Non-medical: Not on file  Tobacco Use  . Smoking status: Never Smoker  . Smokeless tobacco: Never Used  Substance and Sexual Activity  . Alcohol use: Yes    Alcohol/week: 4.0 standard drinks    Types: 4 Cans of beer per week  . Drug use: No  . Sexual activity: Yes  Lifestyle  . Physical activity:    Days per week: Not on file    Minutes  per session: Not on file  . Stress: Not on file  Relationships  . Social connections:    Talks on phone: Not on file    Gets together: Not on file    Attends religious service: Not on file    Active member of club or organization: Not on file    Attends meetings of clubs or organizations: Not on file    Relationship status: Not on file  Other Topics Concern  . Not on file  Social History Narrative   Born and raised [place], [place]. Currently resides in a house with his wife and 2 children. 2 dogs. Fun: Softball, gym   Denies religious beliefs that would effect health care.     Objective:  Physical Exam: BP 110/66 (BP Location: Left Arm, Patient Position: Sitting, Cuff Size: Normal)   Pulse 74   Temp 97.7 F (36.5 C) (Oral)   Ht 6' (1.829 m)   Wt 196 lb 6.1 oz (89.1 kg)   SpO2 99%   BMI 26.63 kg/m   Body mass index is 26.63 kg/m. Wt Readings from Last 3 Encounters:  04/26/18 196 lb 6.1 oz (89.1 kg)  05/26/17 197 lb 3.2 oz (89.4 kg)  11/11/16 184 lb (83.5 kg)   Gen: NAD, resting comfortably HEENT: TMs normal bilaterally. OP clear. No thyromegaly noted.  CV: RRR with no murmurs appreciated Pulm: NWOB, CTAB with no crackles, wheezes, or rhonchi GI: Normal bowel sounds present. Soft, Nontender, Nondistended. MSK: no edema, cyanosis, or clubbing noted Skin: warm, dry Neuro: CN2-12 grossly intact. Strength 5/5 in upper and lower extremities. Reflexes symmetric and intact bilaterally.  Psych: Normal affect and thought content  Assessment/Plan:  BMI 26.63 Continue lifestyle modifications.  Preventative Healthcare: Check lipid panel screen for lipids.  Check A1c to screen for diabetes.  Patient Counseling(The following topics were reviewed and/or handout was given):  -Nutrition: Stressed importance of moderation in sodium/caffeine intake, saturated fat and cholesterol, caloric balance, sufficient intake of fresh fruits, vegetables, and fiber.  -Stressed the importance of  regular exercise.   -Substance Abuse: Discussed cessation/primary prevention of tobacco, alcohol, or other drug use; driving or other dangerous activities under the influence; availability of treatment for abuse.   -Injury prevention: Discussed safety belts, safety helmets, smoke detector, smoking near bedding or upholstery.   -Sexuality: Discussed sexually transmitted diseases, partner selection, use of condoms, avoidance of unintended pregnancy and contraceptive alternatives.   -Dental health: Discussed importance of regular tooth brushing, flossing, and dental visits.  -Health maintenance and immunizations reviewed. Please refer to Health maintenance section.  Return to care in 1 year for next preventative visit.   Katina Degree. [name], MD 04/26/2018 9:19 AM

## 2018-12-01 ENCOUNTER — Ambulatory Visit (HOSPITAL_COMMUNITY)
Admission: EM | Admit: 2018-12-01 | Discharge: 2018-12-01 | Disposition: A | Discharge status: Home / Self Care | Payer: Managed Care, Other (non HMO) | Attending: Emergency Medicine | Admitting: Emergency Medicine

## 2018-12-01 ENCOUNTER — Other Ambulatory Visit: Payer: Self-pay

## 2018-12-01 ENCOUNTER — Encounter (HOSPITAL_COMMUNITY): Payer: Self-pay | Admitting: Emergency Medicine

## 2018-12-01 DIAGNOSIS — M7022 Olecranon bursitis, left elbow: Secondary | ICD-10-CM | POA: Diagnosis not present

## 2018-12-01 MED ORDER — NAPROXEN 500 MG PO TABS: 500.0000 mg | ORAL_TABLET | Freq: Two times a day (BID) | ORAL | 0 refills | Status: DC

## 2018-12-01 NOTE — ED Triage Notes (Signed)
PT has red, swollen left elbow. Started yesterday. No injury.

## 2018-12-01 NOTE — ED Provider Notes (Signed)
[facility]    CSN: [id number] Arrival date & time: 12/01/18  1101      History   Chief Complaint Chief Complaint  Patient presents with  . Appointment  . Joint Swelling    HPI Greg Cooper is a 43 y.o. male no contributing past medical history presenting today for evaluation of left elbow swelling and redness.  Patient states that beginning yesterday he started to have swelling to his left elbow.  His pain has been mild, rating 2 out of 10.  Denies difficulty bending elbow.  Denies any injury.  Denies any trauma to elbow.  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  HPI  Past Medical History:  Diagnosis Date  . Pneumonia     Patient Active Problem List   Diagnosis Date Noted  . Dyslipidemia 04/26/2018    Past Surgical History:  Procedure Laterality Date  . VASECTOMY         Home Medications    Prior to Admission medications   Medication Sig Start Date End Date Taking? Authorizing Provider  naproxen (NAPROSYN) 500 MG tablet Take 1 tablet (500 mg total) by mouth 2 (two) times daily. 12/01/18   Wieters, Elesa Hacker, PA-C    Family History Family History  Problem Relation Age of Onset  . Leukemia Father   . Cancer Father        Leukemia  . Hypertension Father   . Colon cancer Neg Hx   . Prostate cancer Neg Hx     Social History Social History   Tobacco Use  . Smoking status: Never Smoker  . Smokeless tobacco: Never Used  Substance Use Topics  . Alcohol use: Yes    Alcohol/week: 4.0 standard drinks    Types: 4 Cans of beer per week  . Drug use: No     Allergies   Patient has no known allergies.   Review of Systems Review of Systems  Constitutional: Negative for fatigue and fever.  Eyes: Negative for redness, itching and visual disturbance.  Respiratory: Negative for shortness of breath.   Cardiovascular: Negative for chest pain and leg swelling.  Gastrointestinal: Negative for nausea and vomiting.  Musculoskeletal:  Positive for joint swelling. Negative for arthralgias and myalgias.  Skin: Positive for color change. Negative for rash and wound.  Neurological: Negative for dizziness, syncope, weakness, light-headedness and headaches.     Physical Exam Triage Vital Signs ED Triage Vitals  Enc Vitals Group     BP 12/01/18 1145 133/72     Pulse Rate 12/01/18 1145 75     Resp 12/01/18 1145 16     Temp 12/01/18 1145 98.6 F (37 C)     Temp Source 12/01/18 1145 Oral     SpO2 12/01/18 1145 97 %     Weight --      Height --      Head Circumference --      Peak Flow --      Pain Score 12/01/18 1144 2     Pain Loc --      Pain Edu? --      Excl. in [place]? --    No data found.  Updated Vital Signs BP 133/72   Pulse 75   Temp 98.6 F (37 C) (Oral)   Resp 16   SpO2 97%   Visual Acuity Right Eye Distance:   Left Eye Distance:   Bilateral Distance:    Right Eye Near:   Left Eye Near:  Bilateral Near:     Physical Exam Vitals signs and nursing note reviewed.  Constitutional:      Appearance: He is well-developed.     Comments: No acute distress  HENT:     Head: Normocephalic and atraumatic.     Nose: Nose normal.  Eyes:     Conjunctiva/sclera: Conjunctivae normal.  Neck:     Musculoskeletal: Neck supple.  Cardiovascular:     Rate and Rhythm: Normal rate.  Pulmonary:     Effort: Pulmonary effort is normal. No respiratory distress.  Abdominal:     General: There is no distension.  Musculoskeletal: Normal range of motion.     Comments: Left elbow with swelling and faintly erythematous, minimal tenderness to touch, full active range of motion of elbow  Skin:    General: Skin is warm and dry.     Comments: Small amount of dry skin with scabbing over this area of left elbow  Neurological:     Mental Status: He is alert and oriented to person, place, and time.      UC Treatments / Results  Labs (all labs ordered are listed, but only abnormal results are displayed) Labs  Reviewed - No data to display  EKG   Radiology No results found.  Procedures Procedures (including critical care time)  Medications Ordered in UC Medications - No data to display  Initial Impression / Assessment and Plan / UC Course  I have reviewed the triage vital signs and the nursing notes.  Pertinent labs & imaging results that were available during my care of the patient were reviewed by me and considered in my medical decision making (see chart for details).     Patient appears to have olecranon bursitis, does not appear infectious at this time.  Will treat with Ace wrap and NSAIDs.  Does not seem large enough to drain at this time.Discussed strict return precautions. Patient verbalized understanding and is agreeable with plan.  Final Clinical Impressions(s) / UC Diagnoses   Final diagnoses:  Olecranon bursitis of left elbow     Discharge Instructions     Naprosyn twice daily with food OR Use anti-inflammatories for pain/swelling. You may take up to 800 mg Ibuprofen every 8 hours with food. You may supplement Ibuprofen with Tylenol [phone] mg every 8 hours.   Wear ace wrap for compression  Follow up if developing increased swelling, pain, redness or difficulty bending elbow    ED Prescriptions    Medication Sig Dispense Auth. Provider   naproxen (NAPROSYN) 500 MG tablet Take 1 tablet (500 mg total) by mouth 2 (two) times daily. 30 tablet Wieters, [place] C, PA-C     Controlled Substance Prescriptions [place] Controlled Substance Registry consulted? Not Applicable   Lew DawesWieters, Hallie C, [place]PA-C 12/01/18 1216

## 2018-12-01 NOTE — Discharge Instructions (Signed)
Naprosyn twice daily with food OR Use anti-inflammatories for pain/swelling. You may take up to 800 mg Ibuprofen every 8 hours with food. You may supplement Ibuprofen with Tylenol [phone] mg every 8 hours.   Wear ace wrap for compression  Follow up if developing increased swelling, pain, redness or difficulty bending elbow

## 2019-04-19 ENCOUNTER — Ambulatory Visit: Payer: Managed Care, Other (non HMO) | Attending: Internal Medicine

## 2019-04-19 ENCOUNTER — Other Ambulatory Visit: Payer: Managed Care, Other (non HMO)

## 2019-04-19 DIAGNOSIS — Z20822 Contact with and (suspected) exposure to covid-19: Secondary | ICD-10-CM

## 2019-04-20 LAB — NOVEL CORONAVIRUS, NAA: SARS-CoV-2, NAA: NOT DETECTED

## 2019-04-21 ENCOUNTER — Telehealth: Payer: Self-pay

## 2019-04-21 LAB — NOVEL CORONAVIRUS, NAA: SARS-CoV-2, NAA: NOT DETECTED

## 2019-04-21 NOTE — Telephone Encounter (Signed)
Called [facility] and LM on VM.  Pt has 2 covid results on 04/19/19. Left message with father's name and daughter Khaliq Turay, both birth dates and date of testing. Daughter has no results.  Called pt's mother and LM on VM regarding this and left message.

## 2019-07-04 ENCOUNTER — Encounter: Payer: Self-pay | Admitting: Family Medicine

## 2019-07-04 ENCOUNTER — Ambulatory Visit (INDEPENDENT_AMBULATORY_CARE_PROVIDER_SITE_OTHER): Payer: Managed Care, Other (non HMO) | Admitting: Family Medicine

## 2019-07-04 ENCOUNTER — Other Ambulatory Visit: Payer: Self-pay

## 2019-07-04 VITALS — BP 120/78 | HR 60 | Temp 98.5°F | Ht 72.0 in | Wt 186.8 lb

## 2019-07-04 DIAGNOSIS — Z0001 Encounter for general adult medical examination with abnormal findings: Secondary | ICD-10-CM

## 2019-07-04 DIAGNOSIS — E785 Hyperlipidemia, unspecified: Secondary | ICD-10-CM

## 2019-07-04 DIAGNOSIS — Z6825 Body mass index (BMI) 25.0-25.9, adult: Secondary | ICD-10-CM | POA: Diagnosis not present

## 2019-07-04 DIAGNOSIS — E663 Overweight: Secondary | ICD-10-CM | POA: Diagnosis not present

## 2019-07-04 LAB — LIPID PANEL
Cholesterol: 171 mg/dL (ref 0–200)
HDL: 47.3 mg/dL (ref >39.00)
LDL Cholesterol: 103 mg/dL — ABNORMAL HIGH (ref 0–99)
NonHDL: 123.95
Total CHOL/HDL Ratio: 4
Triglycerides: 107 mg/dL (ref 0.0–149.0)
VLDL: 21.4 mg/dL (ref 0.0–40.0)

## 2019-07-04 NOTE — Progress Notes (Signed)
Please inform patient of the following:  Cholesterol numbers look much better. Would like for him to keep up the good work and we can recheck in a year or so.  Katina Degree. [name], MD 07/04/2019 3:49 PM

## 2019-07-04 NOTE — Patient Instructions (Signed)
It was very nice to see you today!  We will check blood work today.  Keep up the good work with your diet and exercise.  Come back in 1 year for your next physical, or sooner if needed.  Take care, Dr Jerline Pain  Please try these tips to maintain a healthy lifestyle:   Eat at least 3 REAL meals and 1-2 snacks per day.  Aim for no more than 5 hours between eating.  If you eat breakfast, please do so within one hour of getting up.    Each meal should contain half fruits/vegetables, one quarter protein, and one quarter carbs (no bigger than a computer mouse)   Cut down on sweet beverages. This includes juice, soda, and sweet tea.     Drink at least 1 glass of water with each meal and aim for at least 8 glasses per day   Exercise at least 150 minutes every week.    Preventive Care 3-28 Years Old, Male Preventive care refers to lifestyle choices and visits with your health care provider that can promote health and wellness. This includes:  A yearly physical exam. This is also called an annual well check.  Regular dental and eye exams.  Immunizations.  Screening for certain conditions.  Healthy lifestyle choices, such as eating a healthy diet, getting regular exercise, not using drugs or products that contain nicotine and tobacco, and limiting alcohol use. What can I expect for my preventive care visit? Physical exam Your health care provider will check:  Height and weight. These may be used to calculate body mass index (BMI), which is a measurement that tells if you are at a healthy weight.  Heart rate and blood pressure.  Your skin for abnormal spots. Counseling Your health care provider may ask you questions about:  Alcohol, tobacco, and drug use.  Emotional well-being.  Home and relationship well-being.  Sexual activity.  Eating habits.  Work and work Statistician. What immunizations do I need?  Influenza (flu) vaccine  This is recommended every  year. Tetanus, diphtheria, and pertussis (Tdap) vaccine  You may need a Td booster every 10 years. Varicella (chickenpox) vaccine  You may need this vaccine if you have not already been vaccinated. Zoster (shingles) vaccine  You may need this after age 54. Measles, mumps, and rubella (MMR) vaccine  You may need at least one dose of MMR if you were born in 1957 or later. You may also need a second dose. Pneumococcal conjugate (PCV13) vaccine  You may need this if you have certain conditions and were not previously vaccinated. Pneumococcal polysaccharide (PPSV23) vaccine  You may need one or two doses if you smoke cigarettes or if you have certain conditions. Meningococcal conjugate (MenACWY) vaccine  You may need this if you have certain conditions. Hepatitis A vaccine  You may need this if you have certain conditions or if you travel or work in places where you may be exposed to hepatitis A. Hepatitis B vaccine  You may need this if you have certain conditions or if you travel or work in places where you may be exposed to hepatitis B. Haemophilus influenzae type b (Hib) vaccine  You may need this if you have certain risk factors. Human papillomavirus (HPV) vaccine  If recommended by your health care provider, you may need three doses over 6 months. You may receive vaccines as individual doses or as more than one vaccine together in one shot (combination vaccines). Talk with your health care provider about  the risks and benefits of combination vaccines. What tests do I need? Blood tests  Lipid and cholesterol levels. These may be checked every 5 years, or more frequently if you are over 21 years old.  Hepatitis C test.  Hepatitis B test. Screening  Lung cancer screening. You may have this screening every year starting at age 92 if you have a 30-pack-year history of smoking and currently smoke or have quit within the past 15 years.  Prostate cancer screening.  Recommendations will vary depending on your family history and other risks.  Colorectal cancer screening. All adults should have this screening starting at age 68 and continuing until age 94. Your health care provider may recommend screening at age 50 if you are at increased risk. You will have tests every 1-10 years, depending on your results and the type of screening test.  Diabetes screening. This is done by checking your blood sugar (glucose) after you have not eaten for a while (fasting). You may have this done every 1-3 years.  Sexually transmitted disease (STD) testing. Follow these instructions at home: Eating and drinking  Eat a diet that includes fresh fruits and vegetables, whole grains, lean protein, and low-fat dairy products.  Take vitamin and mineral supplements as recommended by your health care provider.  Do not drink alcohol if your health care provider tells you not to drink.  If you drink alcohol: ? Limit how much you have to 0-2 drinks a day. ? Be aware of how much alcohol is in your drink. In the [place] one drink equals one 12 oz bottle of beer (355 mL), one 5 oz glass of wine (148 mL), or one 1 oz glass of hard liquor (44 mL). Lifestyle  Take daily care of your teeth and gums.  Stay active. Exercise for at least 30 minutes on 5 or more days each week.  Do not use any products that contain nicotine or tobacco, such as cigarettes, e-cigarettes, and chewing tobacco. If you need help quitting, ask your health care provider.  If you are sexually active, practice safe sex. Use a condom or other form of protection to prevent STIs (sexually transmitted infections).  Talk with your health care provider about taking a low-dose aspirin every day starting at age 61. What's next?  Go to your health care provider once a year for a well check visit.  Ask your health care provider how often you should have your eyes and teeth checked.  Stay up to date on all vaccines. This  information is not intended to replace advice given to you by your health care provider. Make sure you discuss any questions you have with your health care provider. Document Revised: 03/08/2018 Document Reviewed: 03/08/2018 Elsevier Patient Education  2020 [employer].

## 2019-07-04 NOTE — Assessment & Plan Note (Signed)
Has made great changes with diet and exercise.  We will recheck lipid panel today.  Continue lifestyle modifications.

## 2019-07-04 NOTE — Progress Notes (Signed)
Chief Complaint:  Greg Cooper is a 44 y.o. male who presents today for his annual comprehensive physical exam.    Assessment/Plan:  Chronic Problems Addressed Today: Dyslipidemia Has made great changes with diet and exercise.  We will recheck lipid panel today.  Continue lifestyle modifications.   Body mass index is 25.33 kg/m. / Overweight BMI Metric Follow Up - 07/04/19 1033      BMI Metric Follow Up-Please document annually   BMI Metric Follow Up  Education provided        Preventative Healthcare: Check lipid panel.  Has already received both Covid vaccines.  Will be due for colon cancer screening at age 11.  Patient Counseling(The following topics were reviewed and/or handout was given):  -Nutrition: Stressed importance of moderation in sodium/caffeine intake, saturated fat and cholesterol, caloric balance, sufficient intake of fresh fruits, vegetables, and fiber.  -Stressed the importance of regular exercise.   -Substance Abuse: Discussed cessation/primary prevention of tobacco, alcohol, or other drug use; driving or other dangerous activities under the influence; availability of treatment for abuse.   -Injury prevention: Discussed safety belts, safety helmets, smoke detector, smoking near bedding or upholstery.   -Sexuality: Discussed sexually transmitted diseases, partner selection, use of condoms, avoidance of unintended pregnancy and contraceptive alternatives.   -Dental health: Discussed importance of regular tooth brushing, flossing, and dental visits.  -Health maintenance and immunizations reviewed. Please refer to Health maintenance section.  Return to care in 1 year for next preventative visit.     Subjective:  HPI:  He has no acute complaints today.   Lifestyle Diet: Balanced. Plenty of fruits and vegetables.  Exercise: Getting back into working out. Works out at least 3-4 times per week.   Depression screen PHQ 2/9 04/26/2018  Decreased Interest 0   Down, Depressed, Hopeless 0  PHQ - 2 Score 0    Health Maintenance Due  Topic Date Due  . HIV Screening  Never done     ROS: Per HPI, otherwise a complete review of systems was negative.   PMH:  The following were reviewed and entered/updated in epic: Past Medical History:  Diagnosis Date  . Pneumonia    Patient Active Problem List   Diagnosis Date Noted  . Dyslipidemia 04/26/2018   Past Surgical History:  Procedure Laterality Date  . VASECTOMY      Family History  Problem Relation Age of Onset  . Leukemia Father   . Cancer Father        Leukemia  . Hypertension Father   . Colon cancer Neg Hx   . Prostate cancer Neg Hx     Medications- reviewed and updated Current Outpatient Medications  Medication Sig Dispense Refill  . Multiple Vitamin (MULTIVITAMIN) tablet Take 1 tablet by mouth daily.     No current facility-administered medications for this visit.    Allergies-reviewed and updated No Known Allergies  Social History   Socioeconomic History  . Marital status: Single    Spouse name: Not on file  . Number of children: 3  . Years of education: 79  . Highest education level: Not on file  Occupational History  . Occupation: Furniture conservator/restorer  Tobacco Use  . Smoking status: Never Smoker  . Smokeless tobacco: Never Used  Substance and Sexual Activity  . Alcohol use: Yes    Alcohol/week: 4.0 standard drinks    Types: 4 Cans of beer per week  . Drug use: No  . Sexual activity: Yes  Other Topics Concern  .  Not on file  Social History Narrative   Born and raised [place], [place]. Currently resides in a house with his wife and 2 children. 2 dogs. Fun: Softball, gym   Denies religious beliefs that would effect health care.    Social Determinants of Health   Financial Resource Strain:   . Difficulty of Paying Living Expenses:   Food Insecurity:   . Worried About Programme researcher, broadcasting/film/video in the Last Year:   . Barista in the Last Year:   Transportation  Needs:   . Freight forwarder (Medical):   Marland Kitchen Lack of Transportation (Non-Medical):   Physical Activity:   . Days of Exercise per Week:   . Minutes of Exercise per Session:   Stress:   . Feeling of Stress :   Social Connections:   . Frequency of Communication with Friends and Family:   . Frequency of Social Gatherings with Friends and Family:   . Attends Religious Services:   . Active Member of Clubs or Organizations:   . Attends Banker Meetings:   Marland Kitchen Marital Status:         Objective:  Physical Exam: BP 120/78 (BP Location: Left Arm, Patient Position: Sitting, Cuff Size: Normal)   Pulse 60   Temp 98.5 F (36.9 C) (Temporal)   Ht 6' (1.829 m)   Wt 186 lb 12.8 oz (84.7 kg)   SpO2 98%   BMI 25.33 kg/m   Body mass index is 25.33 kg/m. Wt Readings from Last 3 Encounters:  07/04/19 186 lb 12.8 oz (84.7 kg)  04/26/18 196 lb 6.1 oz (89.1 kg)  05/26/17 197 lb 3.2 oz (89.4 kg)  Gen: NAD, resting comfortably HEENT: TMs normal bilaterally. OP clear. No thyromegaly noted.  CV: RRR with no murmurs appreciated Pulm: NWOB, CTAB with no crackles, wheezes, or rhonchi GI: Normal bowel sounds present. Soft, Nontender, Nondistended. MSK: no edema, cyanosis, or clubbing noted Skin: warm, dry Neuro: CN2-12 grossly intact. Strength 5/5 in upper and lower extremities. Reflexes symmetric and intact bilaterally.  Psych: Normal affect and thought content     Elicia Lui M. [name], MD 07/04/2019 10:34 AM

## 2019-11-05 ENCOUNTER — Other Ambulatory Visit: Payer: Managed Care, Other (non HMO)

## 2019-11-05 ENCOUNTER — Other Ambulatory Visit: Payer: Self-pay

## 2019-11-05 DIAGNOSIS — Z20822 Contact with and (suspected) exposure to covid-19: Secondary | ICD-10-CM

## 2019-11-07 LAB — SARS-COV-2, NAA 2 DAY TAT

## 2019-11-07 LAB — NOVEL CORONAVIRUS, NAA: SARS-CoV-2, NAA: NOT DETECTED

## 2020-07-20 ENCOUNTER — Encounter: Payer: Self-pay | Admitting: Family Medicine

## 2020-07-20 ENCOUNTER — Telehealth: Payer: Self-pay

## 2020-07-20 ENCOUNTER — Telehealth (INDEPENDENT_AMBULATORY_CARE_PROVIDER_SITE_OTHER): Payer: Managed Care, Other (non HMO) | Admitting: Family Medicine

## 2020-07-20 VITALS — Temp 98.0°F | Ht 72.0 in | Wt 185.0 lb

## 2020-07-20 DIAGNOSIS — U071 COVID-19: Secondary | ICD-10-CM | POA: Diagnosis not present

## 2020-07-20 MED ORDER — BENZONATATE 200 MG PO CAPS: 200.0000 mg | ORAL_CAPSULE | Freq: Two times a day (BID) | ORAL | 0 refills | Status: DC | PRN

## 2020-07-20 MED ORDER — AZITHROMYCIN 250 MG PO TABS: ORAL_TABLET | ORAL | 0 refills | Status: DC

## 2020-07-20 MED ORDER — IPRATROPIUM BROMIDE 0.06 % NA SOLN: 2.0000 | Freq: Four times a day (QID) | NASAL | 12 refills | Status: DC

## 2020-07-20 NOTE — Telephone Encounter (Signed)
Patient was notified Stated had Hx of pneumonia in the past.  Per patient wife requesting if is ok to send Rx due to his past history

## 2020-07-20 NOTE — Telephone Encounter (Signed)
Patient is requesting a call back he had apt this morning and states he forgot to mention something during his pt  Please call back when able

## 2020-07-20 NOTE — Telephone Encounter (Signed)
Nurse Assessment Nurse: Lurlean Nanny, RN, Catrina Date/Time (Eastern Time): 07/19/2020 10:51:12 AM Confirm and document reason for call. If symptomatic, describe symptoms. ---Caller states husband tested positive for COVID this morning. Has headache, dizzy, fatigue. Afebrile. He did have pneumonia about two years ago. Denies other symptoms. Does the patient have any new or worsening symptoms? ---Yes Will a triage be completed? ---Yes Related visit to physician within the last 2 weeks? ---No Does the PT have any chronic conditions? (i.e. diabetes, asthma, this includes High risk factors for pregnancy, etc.) ---No Is this a behavioral health or substance abuse call? ---No Guidelines Guideline Title Affirmed Question Affirmed Notes Nurse Date/Time ([place] Time) COVID-19 - Diagnosed or Suspected [1] COVID-19 diagnosed by positive lab test (e.g., PCR, rapid self-test kit) AND Guiro, RN, Catrina 07/19/2020 10:54:25 AM PLEASE NOTE: All timestamps contained within this report are represented as [place] Standard Time. CONFIDENTIALTY NOTICE: This fax transmission is intended only for the addressee. It contains information that is legally privileged, confidential or otherwise protected from use or disclosure. If you are not the intended recipient, you are strictly prohibited from reviewing, disclosing, copying using or disseminating any of this information or taking any action in reliance on or regarding this information. If you have received this fax in error, please notify us immediately by telephone so that we can arrange for its return to [place]. Phone: [phone], Toll-Free: [phone], Fax: [phone] Page: 2 of 2 Call Id: [id number] Guidelines Guideline Title Affirmed Question Affirmed Notes Nurse Date/Time Eilene Ghazi Time) [2] mild symptoms (e.g., cough, fever, others) AND [3] no complications or SOB Disp. Time Eilene Ghazi Time) Disposition Final User 07/19/2020 11:06:17 AM Call PCP  when Office is Open Yes Guiro, RN, Catrina Disposition Overriden: Home Care Override Reason: Specify reason. (Please document in 'advice recommended' section) Caller Disagree/Comply Comply Caller Understands Yes PreDisposition Call a family member Care Advice Given Per Guideline CALL PCP WHEN OFFICE IS OPEN: * You need to discuss this with your doctor (or NP/PA) within the next few days. * Call the office when it is open. GENERAL CARE ADVICE FOR COVID-19 SYMPTOMS: * The symptoms are generally treated the same whether you have COVID-19, influenza or some other respiratory virus. * Cough: Use cough drops. * Feeling dehydrated: Drink extra liquids. If the air in your home is dry, use a humidifier. * Fever: For fever over 101 F (38.3 C), take acetaminophen every 4 to 6 hours (Adults 650 mg) OR ibuprofen every 6 to 8 hours (Adults 400 mg). Before taking any medicine, read all the instructions on the package. Do not take aspirin unless your doctor has prescribed it for you. * Muscle aches, headache, and other pains: Often this comes and goes with the fever. Take acetaminophen every 4 to 6 hours (Adults 650 mg) OR ibuprofen every 6 to 8 hours (Adults 400 mg). Before taking any medicine, read all the instructions on the package. * Sore throat: Try throat lozenges, hard candy or warm chicken broth. CALL BACK IF: * Fever over 103 F (39.4 C) * Fever lasts over 3 days * Fever returns after being gone for 24 hours * Chest pain or difficulty breathing occurs * You become worse Comments User: Rica Records, RN Date/Time Eilene Ghazi Time): 07/19/2020 10:59:13 AM Began to have symptoms on Saturday evening 07/18/20 User: Rica Records, RN Date/Time Eilene Ghazi Time): 07/19/2020 11:06:14 AM New Disposition to call PCP when office is open d/t patient's wife being very concerned with husband's past history with pneumonia. Referrals REFERRED TO PCP OFFIC

## 2020-07-20 NOTE — Telephone Encounter (Signed)
Patient aware  Will call if no changes

## 2020-07-20 NOTE — Telephone Encounter (Signed)
I believe he is referring to paxlovid. This is for patients that are high risk of developing severe covid. HE does not meet those criteria and I do not think his insurance would pay for it.  Katina Degree. [name], MD 07/20/2020 10:47 AM

## 2020-07-20 NOTE — Telephone Encounter (Signed)
Patient requesting Rx for Covid antivirus if is possible

## 2020-07-20 NOTE — Progress Notes (Signed)
   Greg Cooper is a 45 y.o. male who presents today for a telephone visit.  Assessment/Plan:  New/Acute Problems: COVID 19 No red flags.  Patient overall low risk.   He is concerned because he does have a history of walking pneumonia.  We will send in a pocket prescription of azithromycin with instruction to not start unless symptoms worsen or if he develops other symptoms of pneumonia including fevers.  We will also treat symptoms with Atrovent nasal spray and Tessalon for cough.  Encourage good oral hydration.  Discussed reasons to return to care.    Subjective:  HPI:  Symptoms started about 2 days ago. Started havingsome body aches.  Other symptoms include headache, nasal congestion, and cough. He tested positive for covid yesterday. Several family members have been sick with covid also. Tried taking acetaminophen. Feels like a mild head cold.        Objective/Observations   NAD  Telephone Visit   I connected with Greg Cooper on 07/20/20 at  4:00 PM EDT via telephone and verified that I am speaking with the correct person using two identifiers. I discussed the limitations of evaluation and management by telemedicine and the availability of in person appointments. The patient expressed understanding and agreed to proceed.   Patient location: Home Provider location: [facility] Persons participating in the virtual visit: Myself and Patient  A total of 11 minutes were spent on medical discussion.      Katina Degree. [name], MD 07/20/2020 9:22 AM

## 2020-07-20 NOTE — Telephone Encounter (Signed)
I can send it it but history of pneumonia is not a high risk category.  Greg Cooper. [name], MD 07/20/2020 11:07 AM

## 2020-07-20 NOTE — Telephone Encounter (Signed)
Patient was seen today.

## 2021-12-20 ENCOUNTER — Encounter: Payer: Self-pay | Admitting: *Deleted

## 2022-03-10 ENCOUNTER — Encounter: Payer: Self-pay | Admitting: *Deleted

## 2022-05-20 ENCOUNTER — Ambulatory Visit: Payer: Managed Care, Other (non HMO) | Admitting: Family Medicine

## 2022-05-24 ENCOUNTER — Encounter: Payer: Self-pay | Admitting: Family Medicine

## 2022-05-24 ENCOUNTER — Ambulatory Visit (INDEPENDENT_AMBULATORY_CARE_PROVIDER_SITE_OTHER): Payer: Managed Care, Other (non HMO) | Admitting: Family Medicine

## 2022-05-24 VITALS — BP 111/70 | HR 77 | Temp 98.2°F | Ht 72.0 in | Wt 202.0 lb

## 2022-05-24 DIAGNOSIS — Z1211 Encounter for screening for malignant neoplasm of colon: Secondary | ICD-10-CM

## 2022-05-24 DIAGNOSIS — Z1159 Encounter for screening for other viral diseases: Secondary | ICD-10-CM

## 2022-05-24 DIAGNOSIS — R739 Hyperglycemia, unspecified: Secondary | ICD-10-CM | POA: Diagnosis not present

## 2022-05-24 DIAGNOSIS — Z114 Encounter for screening for human immunodeficiency virus [HIV]: Secondary | ICD-10-CM | POA: Diagnosis not present

## 2022-05-24 DIAGNOSIS — E785 Hyperlipidemia, unspecified: Secondary | ICD-10-CM

## 2022-05-24 DIAGNOSIS — Z0001 Encounter for general adult medical examination with abnormal findings: Secondary | ICD-10-CM | POA: Diagnosis not present

## 2022-05-24 LAB — COMPREHENSIVE METABOLIC PANEL
ALT: 14 U/L (ref 0–53)
AST: 14 U/L (ref 0–37)
Albumin: 4.3 g/dL (ref 3.5–5.2)
Alkaline Phosphatase: 46 U/L (ref 39–117)
BUN: 20 mg/dL (ref 6–23)
CO2: 29 mEq/L (ref 19–32)
Calcium: 9.6 mg/dL (ref 8.4–10.5)
Chloride: 104 mEq/L (ref 96–112)
Creatinine, Ser: 1 mg/dL (ref 0.40–1.50)
GFR: 91.32 mL/min (ref >60.00)
Glucose, Bld: 93 mg/dL (ref 70–99)
Potassium: 4.5 mEq/L (ref 3.5–5.1)
Sodium: 140 mEq/L (ref 135–145)
Total Bilirubin: 1.4 mg/dL — ABNORMAL HIGH (ref 0.2–1.2)
Total Protein: 6.9 g/dL (ref 6.0–8.3)

## 2022-05-24 LAB — LIPID PANEL
Cholesterol: 183 mg/dL (ref 0–200)
HDL: 43.1 mg/dL (ref >39.00)
LDL Cholesterol: 117 mg/dL — ABNORMAL HIGH (ref 0–99)
NonHDL: 139.99
Total CHOL/HDL Ratio: 4
Triglycerides: 116 mg/dL (ref 0.0–149.0)
VLDL: 23.2 mg/dL (ref 0.0–40.0)

## 2022-05-24 LAB — CBC
HCT: 43.5 % (ref 39.0–52.0)
Hemoglobin: 15 g/dL (ref 13.0–17.0)
MCHC: 34.4 g/dL (ref 30.0–36.0)
MCV: 90.6 fl (ref 78.0–100.0)
Platelets: 218 10*3/uL (ref 150.0–400.0)
RBC: 4.81 Mil/uL (ref 4.22–5.81)
RDW: 12.5 % (ref 11.5–15.5)
WBC: 5 10*3/uL (ref 4.0–10.5)

## 2022-05-24 LAB — HEMOGLOBIN A1C: Hgb A1c MFr Bld: 5 % (ref 4.6–6.5)

## 2022-05-24 NOTE — Patient Instructions (Addendum)
It was very nice to see you today!  We will check blood work today.  I will refer you for colonoscopy.  Please continue to work on diet and exercise.  Please come back in a year for your next physical.  Come back to see [place] sooner if needed.  Take care, Dr Jerline Pain  PLEASE NOTE:  If you had any lab tests, please let us know if you have not heard back within a few days. You may see your results on mychart before we have a chance to review them but we will give you a call once they are reviewed by [place].   If we ordered any referrals today, please let us know if you have not heard from their office within the next week.   If you had any urgent prescriptions sent in today, please check with the pharmacy within an hour of our visit to make sure the prescription was transmitted appropriately.   Please try these tips to maintain a healthy lifestyle:  Eat at least 3 REAL meals and 1-2 snacks per day.  Aim for no more than 5 hours between eating.  If you eat breakfast, please do so within one hour of getting up.   Each meal should contain half fruits/vegetables, one quarter protein, and one quarter carbs (no bigger than a computer mouse)  Cut down on sweet beverages. This includes juice, soda, and sweet tea.   Drink at least 1 glass of water with each meal and aim for at least 8 glasses per day  Exercise at least 150 minutes every week.

## 2022-05-24 NOTE — Assessment & Plan Note (Signed)
Check labs.  Discussed lifestyle modifications. 

## 2022-05-24 NOTE — Progress Notes (Signed)
Chief Complaint:  Greg Cooper is a 47 y.o. male who presents today for his annual comprehensive physical exam.    Assessment/Plan:  Chronic Problems Addressed Today: Dyslipidemia Check labs.  Discussed lifestyle modifications.  Preventative Healthcare: Check labs.  Refer for colonoscopy.  Up-to-date on vaccines.  Patient Counseling(The following topics were reviewed and/or handout was given):  -Nutrition: Stressed importance of moderation in sodium/caffeine intake, saturated fat and cholesterol, caloric balance, sufficient intake of fresh fruits, vegetables, and fiber.  -Stressed the importance of regular exercise.   -Substance Abuse: Discussed cessation/primary prevention of tobacco, alcohol, or other drug use; driving or other dangerous activities under the influence; availability of treatment for abuse.   -Injury prevention: Discussed safety belts, safety helmets, smoke detector, smoking near bedding or upholstery.   -Sexuality: Discussed sexually transmitted diseases, partner selection, use of condoms, avoidance of unintended pregnancy and contraceptive alternatives.   -Dental health: Discussed importance of regular tooth brushing, flossing, and dental visits.  -Health maintenance and immunizations reviewed. Please refer to Health maintenance section.  Return to care in 1 year for next preventative visit.     Subjective:  HPI:  He has no acute complaints today.   Lifestyle Diet: Balanced. Plenty of fruits and vegetables.  Exercise: Gym 2-3 times per week. Using treadmill.      05/24/2022    7:39 AM  Depression screen PHQ 2/9  Decreased Interest 0  Down, Depressed, Hopeless 0  PHQ - 2 Score 0   Health Maintenance Due  Topic Date Due   Hepatitis C Screening  Never done   COVID-19 Vaccine (5 - 2023-24 season) 11/26/2021     ROS: Per HPI, otherwise a complete review of systems was negative.   PMH:  The following were reviewed and entered/updated in epic: Past  Medical History:  Diagnosis Date   Pneumonia    Patient Active Problem List   Diagnosis Date Noted   Dyslipidemia 04/26/2018   Past Surgical History:  Procedure Laterality Date   VASECTOMY      Family History  Problem Relation Age of Onset   Leukemia Father    Cancer Father        Leukemia   Hypertension Father    Colon cancer Neg Hx    Prostate cancer Neg Hx     Medications- reviewed and updated Current Outpatient Medications  Medication Sig Dispense Refill   Multiple Vitamin (MULTIVITAMIN) tablet Take 1 tablet by mouth daily.     No current facility-administered medications for this visit.    Allergies-reviewed and updated No Known Allergies  Social History   Socioeconomic History   Marital status: Single    Spouse name: Not on file   Number of children: 3   Years of education: 18   Highest education level: Not on file  Occupational History   Occupation: Furniture conservator/restorer  Tobacco Use   Smoking status: Never   Smokeless tobacco: Never  Vaping Use   Vaping Use: Never used  Substance and Sexual Activity   Alcohol use: Yes    Alcohol/week: 4.0 standard drinks of alcohol    Types: 4 Cans of beer per week   Drug use: No   Sexual activity: Yes  Other Topics Concern   Not on file  Social History Narrative   Born and raised [place], [place]. Currently resides in a house with his wife and 2 children. 2 dogs. Fun: Softball, gym   Denies religious beliefs that would effect health care.    Social  Determinants of Health   Financial Resource Strain: Not on file  Food Insecurity: Not on file  Transportation Needs: Not on file  Physical Activity: Not on file  Stress: Not on file  Social Connections: Not on file        Objective:  Physical Exam: BP 111/70   Pulse 77   Temp 98.2 F (36.8 C) (Temporal)   Ht 6' (1.829 m)   Wt 202 lb (91.6 kg)   SpO2 98%   BMI 27.40 kg/m   Body mass index is 27.4 kg/m. Wt Readings from Last 3 Encounters:  05/24/22 202 lb  (91.6 kg)  07/20/20 185 lb (83.9 kg)  07/04/19 186 lb 12.8 oz (84.7 kg)   Gen: NAD, resting comfortably HEENT: TMs normal bilaterally. OP clear. No thyromegaly noted.  CV: RRR with no murmurs appreciated Pulm: NWOB, CTAB with no crackles, wheezes, or rhonchi GI: Normal bowel sounds present. Soft, Nontender, Nondistended. MSK: no edema, cyanosis, or clubbing noted Skin: warm, dry Neuro: CN2-12 grossly intact. Strength 5/5 in upper and lower extremities. Reflexes symmetric and intact bilaterally.  Psych: Normal affect and thought content     Jazmynn Pho M. Jerline Pain, MD 05/24/2022 8:04 AM

## 2022-05-25 LAB — HEPATITIS C ANTIBODY: Hepatitis C Ab: NONREACTIVE

## 2022-05-25 LAB — HIV ANTIBODY (ROUTINE TESTING W REFLEX): HIV 1&2 Ab, 4th Generation: NONREACTIVE

## 2022-05-26 NOTE — Progress Notes (Signed)
Please inform patient of the following:  His "bad" cholesterol is up a bit since last time we checked but stable compared to previous values. Also stable.  He can continue to work on diet and exercise and we can recheck in a year or so.

## 2022-06-23 ENCOUNTER — Encounter: Payer: Self-pay | Admitting: Gastroenterology

## 2022-07-08 ENCOUNTER — Ambulatory Visit: Payer: Managed Care, Other (non HMO)

## 2022-07-08 VITALS — Ht 72.0 in | Wt 200.0 lb

## 2022-07-08 DIAGNOSIS — Z1211 Encounter for screening for malignant neoplasm of colon: Secondary | ICD-10-CM

## 2022-07-08 MED ORDER — NA SULFATE-K SULFATE-MG SULF 17.5-3.13-1.6 GM/177ML PO SOLN: 1.0000 | Freq: Once | ORAL | 0 refills | Status: AC

## 2022-07-08 NOTE — Progress Notes (Signed)
Pre visit completed via phone call; Patient verified name, DOB, and address;  No egg or soy allergy known to patient;  No issues known to pt with past sedation with any surgeries or procedures; Patient denies ever being told they had issues or difficulty with intubation;  No FH of Malignant Hyperthermia; Pt is not on diet pills; Pt is not on home 02;  Pt is not on blood thinners;  Pt denies issues with constipation;  No A fib or A flutter; Have any cardiac testing pending--NO Pt instructed to use Singlecare.com or GoodRx for a price reduction on prep   Insurance verified during PV appt=[employer]  Patient's chart reviewed by Cathlyn Parsons CNRA prior to previsit and patient appropriate for the [facility].  Previsit completed and red dot placed by patient's name on their procedure day (on provider's schedule).    Instructions and GoodRx [facility] coupon printed and sent to the patient per his request;

## 2022-07-08 NOTE — Progress Notes (Signed)
Opened in error

## 2022-08-03 ENCOUNTER — Encounter: Payer: Self-pay | Admitting: Gastroenterology

## 2022-08-04 ENCOUNTER — Encounter: Payer: Managed Care, Other (non HMO) | Admitting: Gastroenterology

## 2022-08-12 ENCOUNTER — Encounter: Payer: Self-pay | Admitting: Gastroenterology

## 2022-08-12 ENCOUNTER — Ambulatory Visit: Payer: Managed Care, Other (non HMO) | Admitting: Gastroenterology

## 2022-08-12 ENCOUNTER — Other Ambulatory Visit: Payer: Self-pay | Admitting: Gastroenterology

## 2022-08-12 VITALS — BP 110/56 | HR 58 | Temp 98.1°F | Resp 11 | Ht 72.0 in | Wt 200.0 lb

## 2022-08-12 DIAGNOSIS — D127 Benign neoplasm of rectosigmoid junction: Secondary | ICD-10-CM

## 2022-08-12 DIAGNOSIS — Z1211 Encounter for screening for malignant neoplasm of colon: Secondary | ICD-10-CM | POA: Diagnosis not present

## 2022-08-12 DIAGNOSIS — D125 Benign neoplasm of sigmoid colon: Secondary | ICD-10-CM | POA: Diagnosis not present

## 2022-08-12 DIAGNOSIS — K635 Polyp of colon: Secondary | ICD-10-CM | POA: Diagnosis not present

## 2022-08-12 MED ORDER — SODIUM CHLORIDE 0.9 % IV SOLN: 500.0000 mL | Freq: Once | INTRAVENOUS | Status: DC

## 2022-08-12 NOTE — Op Note (Signed)
[facility] Patient Name: Greg Cooper Procedure Date: 08/12/2022 2:27 PM MRN: [id number] Endoscopist: Lorin Picket E. Tomasa Rand , MD, [id number] Age: 47 Referring MD:  Date of Birth: January 03, 1976 Gender: Male Account #: [id number] Procedure:                Colonoscopy Indications:              Screening for colorectal malignant neoplasm, This                            is the patient's first colonoscopy Medicines:                Monitored Anesthesia Care Procedure:                Pre-Anesthesia Assessment:                           - Prior to the procedure, a History and Physical                            was performed, and patient medications and                            allergies were reviewed. The patient's tolerance of                            previous anesthesia was also reviewed. The risks                            and benefits of the procedure and the sedation                            options and risks were discussed with the patient.                            All questions were answered, and informed consent                            was obtained. Prior Anticoagulants: The patient has                            taken no anticoagulant or antiplatelet agents. ASA                            Grade Assessment: II - A patient with mild systemic                            disease. After reviewing the risks and benefits,                            the patient was deemed in satisfactory condition to                            undergo the procedure.  After obtaining informed consent, the colonoscope                            was passed under direct vision. Throughout the                            procedure, the patient's blood pressure, pulse, and                            oxygen saturations were monitored continuously. The                            CF [id number] #[id number] was introduced through the anus                            and advanced to the  the terminal ileum, with                            identification of the appendiceal orifice and IC                            valve. The colonoscopy was performed without                            difficulty. The patient tolerated the procedure                            well. The quality of the bowel preparation was                            good. The terminal ileum, ileocecal valve,                            appendiceal orifice, and rectum were photographed.                            The bowel preparation used was SUPREP via split                            dose instruction. Scope In: 3:15:14 PM Scope Out: 3:32:58 PM Scope Withdrawal Time: 0 hours 11 minutes 56 seconds  Total Procedure Duration: 0 hours 17 minutes 44 seconds  Findings:                 The perianal and digital rectal examinations were                            normal. Pertinent negatives include normal                            sphincter tone and no palpable rectal lesions.                           A 4 mm polyp was found in the sigmoid colon. The  polyp was sessile. The polyp was removed with a                            cold snare. Resection and retrieval were complete.                            Estimated blood loss was minimal.                           A 7 mm polyp was found in the recto-sigmoid colon.                            The polyp was sessile. The polyp was removed with a                            cold snare. Resection and retrieval were complete.                            Estimated blood loss was minimal.                           The exam was otherwise normal throughout the                            examined colon.                           The terminal ileum appeared normal.                           The retroflexed view of the distal rectum and anal                            verge was normal and showed no anal or rectal                             abnormalities. Complications:            No immediate complications. Estimated Blood Loss:     Estimated blood loss was minimal. Impression:               - One 4 mm polyp in the sigmoid colon, removed with                            a cold snare. Resected and retrieved.                           - One 7 mm polyp at the recto-sigmoid colon,                            removed with a cold snare. Resected and retrieved.                           - The examined portion of the ileum was normal.                           -  The distal rectum and anal verge are normal on                            retroflexion view. Recommendation:           - Patient has a contact number available for                            emergencies. The signs and symptoms of potential                            delayed complications were discussed with the                            patient. Return to normal activities tomorrow.                            Written discharge instructions were provided to the                            patient.                           - Resume previous diet.                           - Continue present medications.                           - Await pathology results.                           - Repeat colonoscopy (date not yet determined) for                            surveillance based on pathology results. Boden Stucky E. Tomasa Rand, MD 08/12/2022 3:38:21 PM This report has been signed electronically.

## 2022-08-12 NOTE — Progress Notes (Signed)
Called to room to assist during endoscopic procedure.  Patient ID and intended procedure confirmed with present staff. Received instructions for my participation in the procedure from the performing physician.  

## 2022-08-12 NOTE — Progress Notes (Unsigned)
A and O x3. Report to RN. Tolerated MAC anesthesia well. 

## 2022-08-12 NOTE — Progress Notes (Unsigned)
[facility] History and Physical   Primary Care Physician:  Ardith Dark, MD   Reason for Procedure:   Colon cancer screening  Plan:    Screening colonoscopy     HPI: Greg Cooper is a 47 y.o. male undergoing initial average risk screening colonoscopy.  He has no family history of colon cancer and no chronic GI symptoms.    Past Medical History:  Diagnosis Date   Pneumonia     Past Surgical History:  Procedure Laterality Date   COLONOSCOPY     VASECTOMY     WISDOM TOOTH EXTRACTION  2006    Prior to Admission medications   Medication Sig Start Date End Date Taking? Authorizing Provider  Multiple Vitamin (MULTIVITAMIN) tablet Take 1 tablet by mouth daily.   Yes [provider]    Current Outpatient Medications  Medication Sig Dispense Refill   Multiple Vitamin (MULTIVITAMIN) tablet Take 1 tablet by mouth daily.     Current Facility-Administered Medications  Medication Dose Route Frequency Provider Last Rate Last Admin   0.9 %  sodium chloride infusion  500 mL Intravenous Once Jenel Lucks, MD        Allergies as of 08/12/2022   (No Known Allergies)    Family History  Problem Relation Age of Onset   Leukemia Father 68   Hypertension Father    Colon cancer Neg Hx    Prostate cancer Neg Hx    Colon polyps Neg Hx    Rectal cancer Neg Hx    Esophageal cancer Neg Hx     Social History   Socioeconomic History   Marital status: Single    Spouse name: Not on file   Number of children: 3   Years of education: 18   Highest education level: Not on file  Occupational History   Occupation: Energy manager  Tobacco Use   Smoking status: Never   Smokeless tobacco: Never  Vaping Use   Vaping Use: Never used  Substance and Sexual Activity   Alcohol use: Yes    Alcohol/week: 0.0 - 6.0 standard drinks of alcohol   Drug use: No   Sexual activity: Yes  Other Topics Concern   Not on file  Social History Narrative   Born and raised  [place], [place]. Currently resides in a house with his wife and 2 children. 2 dogs. Fun: Softball, gym   Denies religious beliefs that would effect health care.    Social Determinants of Health   Financial Resource Strain: Not on file  Food Insecurity: Not on file  Transportation Needs: Not on file  Physical Activity: Not on file  Stress: Not on file  Social Connections: Not on file  Intimate Partner Violence: Not on file    Review of Systems:  All other review of systems negative except as mentioned in the HPI.  Physical Exam: Vital signs BP 119/75   Pulse 65   Temp 98.1 F (36.7 C) (Temporal)   Ht 6' (1.829 m)   Wt 200 lb (90.7 kg)   SpO2 99%   BMI 27.12 kg/m   General:   Alert,  Well-developed, well-nourished, pleasant and cooperative in NAD Airway:  Mallampati 2 Lungs:  Clear throughout to auscultation.   Heart:  Regular rate and rhythm; no murmurs, clicks, rubs,  or gallops. Abdomen:  Soft, nontender and nondistended. Normal bowel sounds.   Neuro/Psych:  Normal mood and affect. A and O x 3   Kjell Brannen E. Tomasa Rand, MD [facility]

## 2022-08-12 NOTE — Patient Instructions (Addendum)
Resume previous diet Continue present medications Await pathology results  Handouts/information given for polyps  YOU HAD AN ENDOSCOPIC PROCEDURE TODAY AT THE [facility]:   Refer to the procedure report that was given to you for any specific questions about what was found during the examination.  If the procedure report does not answer your questions, please call your gastroenterologist to clarify.  If you requested that your care partner not be given the details of your procedure findings, then the procedure report has been included in a sealed envelope for you to review at your convenience later.  YOU SHOULD EXPECT: Some feelings of bloating in the abdomen. Passage of more gas than usual.  Walking can help get rid of the air that was put into your GI tract during the procedure and reduce the bloating. If you had a lower endoscopy (such as a colonoscopy or flexible sigmoidoscopy) you may notice spotting of blood in your stool or on the toilet paper. If you underwent a bowel prep for your procedure, you may not have a normal bowel movement for a few days.  Please Note:  You might notice some irritation and congestion in your nose or some drainage.  This is from the oxygen used during your procedure.  There is no need for concern and it should clear up in a day or so.  SYMPTOMS TO REPORT IMMEDIATELY:  Following lower endoscopy (colonoscopy):  Excessive amounts of blood in the stool  Significant tenderness or worsening of abdominal pains  Swelling of the abdomen that is new, acute  Fever of 100F or higher  For urgent or emergent issues, a gastroenterologist can be reached at any hour by calling [phone]. Do not use MyChart messaging for urgent concerns.    DIET:  We do recommend a small meal at first, but then you may proceed to your regular diet.  Drink plenty of fluids but you should avoid alcoholic beverages for 24 hours.  ACTIVITY:  You should plan to take it easy for  the rest of today and you should NOT DRIVE or use heavy machinery until tomorrow (because of the sedation medicines used during the test).    FOLLOW UP: Our staff will call the number listed on your records the next business day following your procedure.  We will call around 7:15- 8:00 am to check on you and address any questions or concerns that you may have regarding the information given to you following your procedure. If we do not reach you, we will leave a message.     If any biopsies were taken you will be contacted by phone or by letter within the next 1-3 weeks.  Please call us at [phone] if you have not heard about the biopsies in 3 weeks.    SIGNATURES/CONFIDENTIALITY: You and/or your care partner have signed paperwork which will be entered into your electronic medical record.  These signatures attest to the fact that that the information above on your After Visit Summary has been reviewed and is understood.  Full responsibility of the confidentiality of this discharge information lies with you and/or your care-partner. 

## 2022-08-12 NOTE — Progress Notes (Unsigned)
Pt's states no medical or surgical changes since previsit or office visit. 

## 2022-08-15 ENCOUNTER — Telehealth: Payer: Self-pay | Admitting: *Deleted

## 2022-08-15 NOTE — Telephone Encounter (Signed)
  Follow up Call-     08/12/2022    2:26 PM  Call back number  Post procedure Call Back phone  # [phone]  Permission to leave phone message Yes    Post procedure follow up phone call. No answer at number given.  Left message on voicemail.

## 2022-08-22 NOTE — Progress Notes (Signed)
Greg Cooper,  One polyp which I removed during your recent procedure was proven to be completely benign but is considered a "pre-cancerous" polyp that MAY have grown into cancer if it had not been removed.  Studies shows that at least 20% of women over age 45 and 30% of men over age 54 have pre-cancerous polyps.  Based on current nationally recognized surveillance guidelines, I recommend that you have a repeat colonoscopy in 7 years.   If you develop any new rectal bleeding, abdominal pain or significant bowel habit changes, please contact me before then.

## 2023-06-30 ENCOUNTER — Ambulatory Visit (INDEPENDENT_AMBULATORY_CARE_PROVIDER_SITE_OTHER): Payer: Managed Care, Other (non HMO) | Admitting: Family Medicine

## 2023-06-30 ENCOUNTER — Encounter: Payer: Self-pay | Admitting: Family Medicine

## 2023-06-30 VITALS — BP 121/74 | HR 65 | Temp 97.9°F | Ht 72.0 in | Wt 196.0 lb

## 2023-06-30 DIAGNOSIS — E785 Hyperlipidemia, unspecified: Secondary | ICD-10-CM

## 2023-06-30 DIAGNOSIS — Z0000 Encounter for general adult medical examination without abnormal findings: Secondary | ICD-10-CM | POA: Diagnosis not present

## 2023-06-30 DIAGNOSIS — Z131 Encounter for screening for diabetes mellitus: Secondary | ICD-10-CM | POA: Diagnosis not present

## 2023-06-30 LAB — COMPREHENSIVE METABOLIC PANEL WITH GFR
ALT: 13 U/L (ref 0–53)
AST: 13 U/L (ref 0–37)
Albumin: 4.7 g/dL (ref 3.5–5.2)
Alkaline Phosphatase: 45 U/L (ref 39–117)
BUN: 23 mg/dL (ref 6–23)
CO2: 30 meq/L (ref 19–32)
Calcium: 9.5 mg/dL (ref 8.4–10.5)
Chloride: 104 meq/L (ref 96–112)
Creatinine, Ser: 1.09 mg/dL (ref 0.40–1.50)
GFR: 81.71 mL/min (ref >60.00)
Glucose, Bld: 94 mg/dL (ref 70–99)
Potassium: 4.5 meq/L (ref 3.5–5.1)
Sodium: 141 meq/L (ref 135–145)
Total Bilirubin: 1.9 mg/dL — ABNORMAL HIGH (ref 0.2–1.2)
Total Protein: 7.1 g/dL (ref 6.0–8.3)

## 2023-06-30 LAB — CBC
HCT: 43.5 % (ref 39.0–52.0)
Hemoglobin: 15.2 g/dL (ref 13.0–17.0)
MCHC: 34.8 g/dL (ref 30.0–36.0)
MCV: 90 fl (ref 78.0–100.0)
Platelets: 224 10*3/uL (ref 150.0–400.0)
RBC: 4.83 Mil/uL (ref 4.22–5.81)
RDW: 12.5 % (ref 11.5–15.5)
WBC: 4.5 10*3/uL (ref 4.0–10.5)

## 2023-06-30 LAB — LIPID PANEL
Cholesterol: 168 mg/dL (ref 0–200)
HDL: 41.3 mg/dL (ref >39.00)
LDL Cholesterol: 110 mg/dL — ABNORMAL HIGH (ref 0–99)
NonHDL: 127
Total CHOL/HDL Ratio: 4
Triglycerides: 83 mg/dL (ref 0.0–149.0)
VLDL: 16.6 mg/dL (ref 0.0–40.0)

## 2023-06-30 LAB — TSH: TSH: 1.5 u[IU]/mL (ref 0.35–5.50)

## 2023-06-30 LAB — HEMOGLOBIN A1C: Hgb A1c MFr Bld: 5.1 % (ref 4.6–6.5)

## 2023-06-30 NOTE — Progress Notes (Signed)
 Chief Complaint:  Greg Cooper is a 48 y.o. male who presents today for his annual comprehensive physical exam.    Assessment/Plan:  Chronic Problems Addressed Today: Dyslipidemia Check labs.  He is working on lifestyle modifications.  Preventative Healthcare: Check labs.  Up-to-date on vaccines and screenings.  Patient Counseling(The following topics were reviewed and/or handout was given):  -Nutrition: Stressed importance of moderation in sodium/caffeine intake, saturated fat and cholesterol, caloric balance, sufficient intake of fresh fruits, vegetables, and fiber.  -Stressed the importance of regular exercise.   -Substance Abuse: Discussed cessation/primary prevention of tobacco, alcohol, or other drug use; driving or other dangerous activities under the influence; availability of treatment for abuse.   -Injury prevention: Discussed safety belts, safety helmets, smoke detector, smoking near bedding or upholstery.   -Sexuality: Discussed sexually transmitted diseases, partner selection, use of condoms, avoidance of unintended pregnancy and contraceptive alternatives.   -Dental health: Discussed importance of regular tooth brushing, flossing, and dental visits.  -Health maintenance and immunizations reviewed. Please refer to Health maintenance section.  Return to care in 1 year for next preventative visit.     Subjective:  HPI:  He has no acute complaints today.   Lifestyle Diet: None specific. Trying to get fruits and vegetables.  Exercise: Gym 2-3 times per week. More weight training.      06/30/2023    7:27 AM  Depression screen PHQ 2/9  Decreased Interest 0  Down, Depressed, Hopeless 0  PHQ - 2 Score 0   ROS: Per HPI, otherwise a complete review of systems was negative.   PMH:  The following were reviewed and entered/updated in epic: Past Medical History:  Diagnosis Date   Pneumonia    Patient Active Problem List   Diagnosis Date Noted   Dyslipidemia  04/26/2018   Past Surgical History:  Procedure Laterality Date   COLONOSCOPY     VASECTOMY     WISDOM TOOTH EXTRACTION  2006    Family History  Problem Relation Age of Onset   Leukemia Father 106   Hypertension Father    Colon cancer Neg Hx    Prostate cancer Neg Hx    Colon polyps Neg Hx    Rectal cancer Neg Hx    Esophageal cancer Neg Hx     Medications- reviewed and updated Current Outpatient Medications  Medication Sig Dispense Refill   Multiple Vitamin (MULTIVITAMIN) tablet Take 1 tablet by mouth daily.     No current facility-administered medications for this visit.    Allergies-reviewed and updated No Known Allergies  Social History   Socioeconomic History   Marital status: Single    Spouse name: Not on file   Number of children: 3   Years of education: 18   Highest education level: Not on file  Occupational History   Occupation: Energy manager  Tobacco Use   Smoking status: Never   Smokeless tobacco: Never  Vaping Use   Vaping status: Never Used  Substance and Sexual Activity   Alcohol use: Yes    Alcohol/week: 0.0 - 6.0 standard drinks of alcohol   Drug use: No   Sexual activity: Yes  Other Topics Concern   Not on file  Social History Narrative   Born and raised [place], [place]. Currently resides in a house with his wife and 2 children. 2 dogs. Fun: Softball, gym   Denies religious beliefs that would effect health care.    Social Drivers of Corporate investment banker Strain: Not on file  Food Insecurity: Not on file  Transportation Needs: Not on file  Physical Activity: Not on file  Stress: Not on file  Social Connections: Not on file        Objective:  Physical Exam: BP 121/74   Pulse 65   Temp 97.9 F (36.6 C) (Temporal)   Ht 6' (1.829 m)   Wt 196 lb (88.9 kg)   SpO2 97%   BMI 26.58 kg/m   Body mass index is 26.58 kg/m. Wt Readings from Last 3 Encounters:  06/30/23 196 lb (88.9 kg)  08/12/22 200 lb (90.7 kg)  07/08/22 200 lb  (90.7 kg)   Gen: NAD, resting comfortably HEENT: TMs normal bilaterally. OP clear. No thyromegaly noted.  CV: RRR with no murmurs appreciated Pulm: NWOB, CTAB with no crackles, wheezes, or rhonchi GI: Normal bowel sounds present. Soft, Nontender, Nondistended. MSK: no edema, cyanosis, or clubbing noted Skin: warm, dry Neuro: CN2-12 grossly intact. Strength 5/5 in upper and lower extremities. Reflexes symmetric and intact bilaterally.  Psych: Normal affect and thought content     Capitola Ladson M. [name], MD 06/30/2023 7:59 AM

## 2023-06-30 NOTE — Assessment & Plan Note (Signed)
Check labs.  He is working on lifestyle modifications. 

## 2023-06-30 NOTE — Addendum Note (Signed)
 Addended by: Dyann Kief on: 06/30/2023 08:14 AM   Modules accepted: Orders

## 2023-06-30 NOTE — Patient Instructions (Addendum)
 It was very nice to see you today!  We will check blood work.  Please continue to work on diet and exercise.   Will see you back in a year.  Come back sooner if needed.  Return in about 1 year (around 06/29/2024) for Annual Physical.   Take care, Dr [name]  PLEASE NOTE:  If you had any lab tests, please let us know if you have not heard back within a few days. You may see your results on mychart before we have a chance to review them but we will give you a call once they are reviewed by [place].   If we ordered any referrals today, please let us know if you have not heard from their office within the next week.   If you had any urgent prescriptions sent in today, please check with the pharmacy within an hour of our visit to make sure the prescription was transmitted appropriately.   Please try these tips to maintain a healthy lifestyle:  Eat at least 3 REAL meals and 1-2 snacks per day.  Aim for no more than 5 hours between eating.  If you eat breakfast, please do so within one hour of getting up.   Each meal should contain half fruits/vegetables, one quarter protein, and one quarter carbs (no bigger than a computer mouse)  Cut down on sweet beverages. This includes juice, soda, and sweet tea.   Drink at least 1 glass of water with each meal and aim for at least 8 glasses per day  Exercise at least 150 minutes every week.    Preventive Care 4-64 Years Old, Male Preventive care refers to lifestyle choices and visits with your health care provider that can promote health and wellness. Preventive care visits are also called wellness exams. What can I expect for my preventive care visit? Counseling During your preventive care visit, your health care provider may ask about your: Medical history, including: Past medical problems. Family medical history. Current health, including: Emotional well-being. Home life and relationship well-being. Sexual activity. Lifestyle,  including: Alcohol, nicotine or tobacco, and drug use. Access to firearms. Diet, exercise, and sleep habits. Safety issues such as seatbelt and bike helmet use. Sunscreen use. Work and work Astronomer. Physical exam Your health care provider will check your: Height and weight. These may be used to calculate your BMI (body mass index). BMI is a measurement that tells if you are at a healthy weight. Waist circumference. This measures the distance around your waistline. This measurement also tells if you are at a healthy weight and may help predict your risk of certain diseases, such as type 2 diabetes and high blood pressure. Heart rate and blood pressure. Body temperature. Skin for abnormal spots. What immunizations do I need?  Vaccines are usually given at various ages, according to a schedule. Your health care provider will recommend vaccines for you based on your age, medical history, and lifestyle or other factors, such as travel or where you work. What tests do I need? Screening Your health care provider may recommend screening tests for certain conditions. This may include: Lipid and cholesterol levels. Diabetes screening. This is done by checking your blood sugar (glucose) after you have not eaten for a while (fasting). Hepatitis B test. Hepatitis C test. HIV (human immunodeficiency virus) test. STI (sexually transmitted infection) testing, if you are at risk. Lung cancer screening. Prostate cancer screening. Colorectal cancer screening. Talk with your health care provider about your test results, treatment options, and  if necessary, the need for more tests. Follow these instructions at home: Eating and drinking  Eat a diet that includes fresh fruits and vegetables, whole grains, lean protein, and low-fat dairy products. Take vitamin and mineral supplements as recommended by your health care provider. Do not drink alcohol if your health care provider tells you not to  drink. If you drink alcohol: Limit how much you have to 0-2 drinks a day. Know how much alcohol is in your drink. In the [place] one drink equals one 12 oz bottle of beer (355 mL), one 5 oz glass of wine (148 mL), or one 1 oz glass of hard liquor (44 mL). Lifestyle Brush your teeth every morning and night with fluoride toothpaste. Floss one time each day. Exercise for at least 30 minutes 5 or more days each week. Do not use any products that contain nicotine or tobacco. These products include cigarettes, chewing tobacco, and vaping devices, such as e-cigarettes. If you need help quitting, ask your health care provider. Do not use drugs. If you are sexually active, practice safe sex. Use a condom or other form of protection to prevent STIs. Take aspirin only as told by your health care provider. Make sure that you understand how much to take and what form to take. Work with your health care provider to find out whether it is safe and beneficial for you to take aspirin daily. Find healthy ways to manage stress, such as: Meditation, yoga, or listening to music. Journaling. Talking to a trusted person. Spending time with friends and family. Minimize exposure to UV radiation to reduce your risk of skin cancer. Safety Always wear your seat belt while driving or riding in a vehicle. Do not drive: If you have been drinking alcohol. Do not ride with someone who has been drinking. When you are tired or distracted. While texting. If you have been using any mind-altering substances or drugs. Wear a helmet and other protective equipment during sports activities. If you have firearms in your house, make sure you follow all gun safety procedures. What's next? Go to your health care provider once a year for an annual wellness visit. Ask your health care provider how often you should have your eyes and teeth checked. Stay up to date on all vaccines. This information is not intended to replace advice  given to you by your health care provider. Make sure you discuss any questions you have with your health care provider. Document Revised: 09/09/2020 Document Reviewed: 09/09/2020 Elsevier Patient Education  2024 [employer].

## 2023-07-03 ENCOUNTER — Encounter: Payer: Self-pay | Admitting: Family Medicine

## 2023-07-03 NOTE — Progress Notes (Signed)
 Cholesterol borderline elevated but similar to last year.  He should continue to work on diet and exercise and we can recheck this again in a year or so.  Bilirubin is up a little bit as well.  This is probably a benign issue but recommend that he come back to recheck fractionated bilirubin level.  Please place future order for this.  The rest of his labs are all at goal.  We can recheck everything else in a year or so.

## 2023-07-04 ENCOUNTER — Other Ambulatory Visit: Payer: Self-pay | Admitting: *Deleted

## 2023-07-04 DIAGNOSIS — R17 Unspecified jaundice: Secondary | ICD-10-CM

## 2023-07-07 ENCOUNTER — Other Ambulatory Visit

## 2023-07-07 DIAGNOSIS — R17 Unspecified jaundice: Secondary | ICD-10-CM

## 2023-07-07 NOTE — Addendum Note (Signed)
 Addended by: Lorn Junes on: 07/07/2023 09:14 AM   Modules accepted: Orders

## 2023-07-08 LAB — BILIRUBIN, FRACTIONATED(TOT/DIR/INDIR)
Bilirubin, Direct: 0.2 mg/dL (ref 0.0–0.2)
Indirect Bilirubin: 1 mg/dL (ref 0.2–1.2)
Total Bilirubin: 1.2 mg/dL (ref 0.2–1.2)

## 2023-07-11 ENCOUNTER — Encounter: Payer: Self-pay | Admitting: Family Medicine

## 2023-07-11 NOTE — Progress Notes (Signed)
 Bilirubin level back to normal. 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 This is benign and will not cause any issues. Do not need to do any further testing at this point.

## 2024-07-02 ENCOUNTER — Encounter: Admitting: Family Medicine
